# Patient Record
Sex: Female | Born: 1975 | Race: Black or African American | Hispanic: No | Marital: Single | Smoking: Never smoker
Health system: Southern US, Community
[De-identification: ages and names within clinical notes are randomized; demographics above are authoritative.]

## PROBLEM LIST (undated history)

## (undated) DIAGNOSIS — N83209 Unspecified ovarian cyst, unspecified side: Secondary | ICD-10-CM

## (undated) HISTORY — PX: NO PAST SURGERIES: SHX2092

---

## 1999-12-26 ENCOUNTER — Other Ambulatory Visit: Admission: RE | Admit: 1999-12-26 | Discharge: 1999-12-26 | Payer: Self-pay | Admitting: Obstetrics and Gynecology

## 2000-01-21 ENCOUNTER — Other Ambulatory Visit: Admission: RE | Admit: 2000-01-21 | Discharge: 2000-01-21 | Payer: Self-pay | Admitting: Obstetrics and Gynecology

## 2001-08-17 ENCOUNTER — Ambulatory Visit (HOSPITAL_COMMUNITY): Admission: RE | Admit: 2001-08-17 | Discharge: 2001-08-17 | Payer: Self-pay | Admitting: Anesthesiology

## 2001-08-17 ENCOUNTER — Encounter: Payer: Self-pay | Admitting: Anesthesiology

## 2001-08-28 ENCOUNTER — Inpatient Hospital Stay (HOSPITAL_COMMUNITY): Admission: EM | Admit: 2001-08-28 | Discharge: 2001-08-30 | Payer: Self-pay | Admitting: Family Medicine

## 2001-08-28 ENCOUNTER — Encounter: Payer: Self-pay | Admitting: General Surgery

## 2001-09-01 ENCOUNTER — Inpatient Hospital Stay (HOSPITAL_COMMUNITY): Admission: AD | Admit: 2001-09-01 | Discharge: 2001-09-01 | Payer: Self-pay | Admitting: Obstetrics and Gynecology

## 2001-09-01 ENCOUNTER — Encounter: Payer: Self-pay | Admitting: Obstetrics and Gynecology

## 2001-09-08 ENCOUNTER — Inpatient Hospital Stay (HOSPITAL_COMMUNITY): Admission: AD | Admit: 2001-09-08 | Discharge: 2001-09-11 | Payer: Self-pay | Admitting: Family Medicine

## 2001-09-08 ENCOUNTER — Encounter: Payer: Self-pay | Admitting: Surgery

## 2001-09-21 ENCOUNTER — Encounter (HOSPITAL_BASED_OUTPATIENT_CLINIC_OR_DEPARTMENT_OTHER): Payer: Self-pay | Admitting: General Surgery

## 2001-09-22 ENCOUNTER — Ambulatory Visit (HOSPITAL_COMMUNITY): Admission: RE | Admit: 2001-09-22 | Discharge: 2001-09-23 | Payer: Self-pay | Admitting: General Surgery

## 2001-09-22 ENCOUNTER — Encounter (INDEPENDENT_AMBULATORY_CARE_PROVIDER_SITE_OTHER): Payer: Self-pay | Admitting: Specialist

## 2001-09-27 ENCOUNTER — Inpatient Hospital Stay (HOSPITAL_COMMUNITY): Admission: AD | Admit: 2001-09-27 | Discharge: 2001-09-27 | Payer: Self-pay | Admitting: *Deleted

## 2002-03-18 ENCOUNTER — Inpatient Hospital Stay (HOSPITAL_COMMUNITY): Admission: AD | Admit: 2002-03-18 | Discharge: 2002-03-20 | Payer: Self-pay | Admitting: Obstetrics

## 2005-01-02 ENCOUNTER — Emergency Department (HOSPITAL_COMMUNITY): Admission: EM | Admit: 2005-01-02 | Discharge: 2005-01-03 | Payer: Self-pay | Admitting: Emergency Medicine

## 2005-01-03 ENCOUNTER — Emergency Department (HOSPITAL_COMMUNITY): Admission: EM | Admit: 2005-01-03 | Discharge: 2005-01-03 | Payer: Self-pay | Admitting: Emergency Medicine

## 2009-02-10 ENCOUNTER — Emergency Department (HOSPITAL_COMMUNITY): Admission: EM | Admit: 2009-02-10 | Discharge: 2009-02-10 | Payer: Self-pay | Admitting: Emergency Medicine

## 2009-10-28 ENCOUNTER — Emergency Department (HOSPITAL_COMMUNITY): Admission: EM | Admit: 2009-10-28 | Discharge: 2009-10-28 | Payer: Self-pay | Admitting: Family Medicine

## 2009-11-26 ENCOUNTER — Ambulatory Visit: Payer: Self-pay | Admitting: Physician Assistant

## 2009-11-26 ENCOUNTER — Inpatient Hospital Stay (HOSPITAL_COMMUNITY): Admission: AD | Admit: 2009-11-26 | Discharge: 2009-11-26 | Payer: Self-pay | Admitting: Obstetrics and Gynecology

## 2009-11-26 ENCOUNTER — Inpatient Hospital Stay (HOSPITAL_COMMUNITY): Admission: AD | Admit: 2009-11-26 | Discharge: 2009-11-26 | Payer: Self-pay | Admitting: Family Medicine

## 2009-12-12 ENCOUNTER — Inpatient Hospital Stay (HOSPITAL_COMMUNITY): Admission: AD | Admit: 2009-12-12 | Discharge: 2009-12-12 | Payer: Self-pay | Admitting: Obstetrics & Gynecology

## 2009-12-20 ENCOUNTER — Inpatient Hospital Stay (HOSPITAL_COMMUNITY): Admission: AD | Admit: 2009-12-20 | Discharge: 2009-12-20 | Payer: Self-pay | Admitting: Obstetrics

## 2009-12-20 ENCOUNTER — Ambulatory Visit: Payer: Self-pay | Admitting: Gynecology

## 2009-12-24 ENCOUNTER — Ambulatory Visit (HOSPITAL_COMMUNITY): Admission: RE | Admit: 2009-12-24 | Discharge: 2009-12-24 | Payer: Self-pay | Admitting: Obstetrics

## 2010-05-17 ENCOUNTER — Inpatient Hospital Stay (HOSPITAL_COMMUNITY)
Admission: AD | Admit: 2010-05-17 | Discharge: 2010-05-21 | Payer: Self-pay | Source: Home / Self Care | Attending: Obstetrics | Admitting: Obstetrics

## 2010-05-19 ENCOUNTER — Encounter (INDEPENDENT_AMBULATORY_CARE_PROVIDER_SITE_OTHER): Payer: Self-pay | Admitting: Obstetrics

## 2010-07-29 LAB — URINALYSIS, ROUTINE W REFLEX MICROSCOPIC
Bilirubin Urine: NEGATIVE
Nitrite: NEGATIVE
Specific Gravity, Urine: 1.02 (ref 1.005–1.030)
Urobilinogen, UA: 1 mg/dL (ref 0.0–1.0)

## 2010-07-29 LAB — CBC
HCT: 38.2 % (ref 36.0–46.0)
Hemoglobin: 13 g/dL (ref 12.0–15.0)
Hemoglobin: 8.8 g/dL — ABNORMAL LOW (ref 12.0–15.0)
MCH: 28.1 pg (ref 26.0–34.0)
MCHC: 32.4 g/dL (ref 30.0–36.0)
MCHC: 34 g/dL (ref 30.0–36.0)
MCV: 82.5 fL (ref 78.0–100.0)
MCV: 84.7 fL (ref 78.0–100.0)
Platelets: 212 10*3/uL (ref 150–400)
RDW: 15.6 % — ABNORMAL HIGH (ref 11.5–15.5)
WBC: 27.7 10*3/uL — ABNORMAL HIGH (ref 4.0–10.5)

## 2010-07-29 LAB — RPR: RPR Ser Ql: NONREACTIVE

## 2010-07-29 LAB — RAPID URINE DRUG SCREEN, HOSP PERFORMED
Cocaine: NOT DETECTED
Opiates: NOT DETECTED
Tetrahydrocannabinol: NOT DETECTED

## 2010-07-29 LAB — MAGNESIUM: Magnesium: 5.7 mg/dL — ABNORMAL HIGH (ref 1.5–2.5)

## 2010-07-29 LAB — URINE MICROSCOPIC-ADD ON

## 2010-08-02 LAB — URINALYSIS, ROUTINE W REFLEX MICROSCOPIC
Bilirubin Urine: NEGATIVE
Glucose, UA: NEGATIVE mg/dL
Specific Gravity, Urine: >1.03 — ABNORMAL HIGH (ref 1.005–1.030)
Urobilinogen, UA: 1 mg/dL (ref 0.0–1.0)

## 2010-08-02 LAB — URINE MICROSCOPIC-ADD ON

## 2010-08-02 LAB — URINE CULTURE

## 2010-08-03 LAB — URINALYSIS, ROUTINE W REFLEX MICROSCOPIC
Glucose, UA: NEGATIVE mg/dL
Hgb urine dipstick: NEGATIVE
Ketones, ur: NEGATIVE mg/dL
Protein, ur: NEGATIVE mg/dL
Specific Gravity, Urine: >1.03 — ABNORMAL HIGH (ref 1.005–1.030)
Urobilinogen, UA: 0.2 mg/dL (ref 0.0–1.0)
pH: 6 (ref 5.0–8.0)

## 2010-08-04 LAB — URINALYSIS, ROUTINE W REFLEX MICROSCOPIC
Bilirubin Urine: NEGATIVE
Glucose, UA: NEGATIVE mg/dL
Hgb urine dipstick: NEGATIVE
Ketones, ur: 15 mg/dL — AB
Protein, ur: NEGATIVE mg/dL
Specific Gravity, Urine: 1.02 (ref 1.005–1.030)
Urobilinogen, UA: 1 mg/dL (ref 0.0–1.0)
pH: 7 (ref 5.0–8.0)

## 2010-08-04 LAB — GC/CHLAMYDIA PROBE AMP, GENITAL
Chlamydia, DNA Probe: NEGATIVE
GC Probe Amp, Genital: NEGATIVE

## 2010-08-04 LAB — CBC
Hemoglobin: 14.4 g/dL (ref 12.0–15.0)
MCH: 29.3 pg (ref 26.0–34.0)

## 2010-08-04 LAB — WET PREP, GENITAL

## 2010-08-04 LAB — URINE MICROSCOPIC-ADD ON

## 2010-10-04 NOTE — Discharge Summary (Signed)
[place]. [facility]  Patient:    Janice Bautista, Janice Bautista Visit Number: [id number] MRN: [id number]          Service Type: OBS Location: [facility] Attending Physician:  Michaelle Copas Dictated by:   Lorelle Formosa, M.D. Admit Date:  09/27/2001 Discharge Date: 09/27/2001   CC:         Luisa Hart L. Lurene Shadow, M.D.   Discharge Summary  ADMISSION DIAGNOSES: 1. Flare-up of cholecystitis with calculous gallbladder disease. 2. Second term missed pregnancy.  DISCHARGE DIAGNOSES: 1. Flare-up of cholecystitis with calculous gallbladder disease. 2. Second term missed pregnancy.  DISCHARGE MEDICATIONS: 1. Vicodin. 2. Phenergan.  CONSULTATIONS: Dr. Leonie Man.  DIET: Low fat.  HISTORY OF PRESENT ILLNESS: The patient is a 35 year old single black woman who presented with repeated nausea and vomiting and intensive epigastric pain radiating to her back. She has known diagnosis of calculous gallbladder disease and is now pregnant. She has not been able to sleep for the past 24+ h ours. She is not able to keep down Vicodin or any other symptomatic medications. The patient has no history of birth control pill use. Her last admission was August 28, 2001 where her gallbladder tests revealed calculous gallbladder disease. She is approximately six weeks pregnant.  PAST MEDICAL HISTORY: As outlined in the admission H&P.  FAMILY HISTORY: As outlined in the admission H&P.  SOCIAL HISTORY: As outlined in the admission H&P.  REVIEW OF SYSTEMS: As outlined in the admission H&P.  PHYSICAL EXAM:  VITAL SIGNS: BP 103/71. Pulse 98, respirations 22, temp 98.2.  GENERAL: The patient appeared uncomfortable, crying, and rolling on the bed.  HEENT: PERRLA. EOMI.  NECK: Supple.  CHEST: Clear to auscultation.  BREASTS: Normal.  ABDOMEN: Tenderness in epigastrium to palpation.  EXTREMITIES: Without clubbing, cyanosis, or edema.  NEURO: Grossly normal.  SKIN: No  lesions.  LABORATORY DATA: CBC revealed white count of 11.6, hemoglobin 13.1. Repeat revealed white count of 7 with hemoglobin of 11.1. Platelets were normal at 215,000. Chemistry revealed sodium of 132, potassium 3.5, chloride 102, BUN 5.  DIAGNOSTIC STUDIES: Abdominal ultrasound reveals an interval development of tumefactor sludge within the proximal portion of the gallbladder with cholesence demonstrated with small cholesterol stones at the anterior margin of the tumefactor sludge. Otherwise, within normal limits.  HOSPITAL COURSE: The patient was treated with symptomatic medications and Cefotan 1 gram IV q.12h. She was managed with a bland diet and given IV fluids. She gradually improved and will be discharged for outpatient management. The effort is to try to get her into the second trimester before considering laparoscopic cholecystectomy. Dictated by:   Lorelle Formosa, M.D. Attending Physician:  Michaelle Copas DD:  10/13/01 TD:  10/15/01 Job: [id number]

## 2010-10-04 NOTE — Op Note (Signed)
[place]. [facility]  Patient:    Janice Bautista, Janice Bautista Visit Number: [id number] MRN: [id number]          Service Type: DSU Location: [phone] Attending Physician:  Sonda Primes Dictated by:   Mardene Celeste. Lurene Shadow, M.D. Proc. Date: 09/22/01 Admit Date:  09/22/2001 Discharge Date: 09/23/2001                             Operative Report  PREOPERATIVE DIAGNOSIS:  Cholecystitis with cholesterolosis.  POSTOPERATIVE DIAGNOSIS:  Cholecystitis with cholesterolosis, pathology pending.  OPERATION PERFORMED:  Laparoscopic cholecystectomy.  SURGEON:  Mardene Celeste. Lurene Shadow, M.D.  ANESTHESIA:  INDICATIONS FOR PROCEDURE:  The patient is a 35 year old woman who is at this point [redacted] weeks pregnant who was admitted to the hospital complaining of abdominal pain.  On work-up she was noted to be afebrile with normal liver function studies.  She did have multiple cholesterol type stones in her gallstones.  She has since been having recurrent and persistent abdominal pain associated with nausea and with vomiting.  The patient is brought to the operating room now for laparoscopic cholecystectomy.  The patient is aware of the risks and potential benefits of laparoscopic cholecystectomy including the risk of fetal loss due to this operative procedure.  She understands these risks and wishes to proceed with the surgery nonetheless.  She gives consent.  DESCRIPTION OF PROCEDURE:  Following the induction of satisfactory general anesthesia, the patient was positioned supinely.  The abdomen was prepped and draped to be included in a sterile operative field.  Open laparoscopy created at the umbilicus with insufflation of the peritoneal cavity to 14 mHg pressure using carbon dioxide.  The abdomen was explored.  Multiple adhesions to the wall of the gallbladder, but no acute inflammation was noted.  The liver edges were sharp, liver surface was smooth, the anterior gastric wall,  duodenal sweep were normal.  There was no small or large intestine viewed that appeared to be abnormal.  On viewing the pelvis, she had normal-appearing three-month size uterus.  Under direct vision, epigastric and lateral ports were placed.  The gallbladder was then grasped and retracted cephalad.  Dissection in the region of the hepatoduodenal ligament allowed isolation of the cystic artery and cystic duct.  The cystic artery was doubly clipped and transected.  The cystic duct was triply clipped and transected.  The gallbladder was then dissected free from the liver bed using electrocautery and maintaining hemostasis throughout the entire course of dissection.  At the end of this period, the pneumoperitoneum was released.  Sponge, instrument and sharp counts were verified. The wound was closed in layers as follows.  The umbilical wound in two layers with 0 Dexon and 4-0 Dexon.  Epigastric wound and lateral flank wounds closed with 4-0 Dexon and reinforced with Steri-Strips.  Sterile dressings applied.  Anesthetic reversed.  Patient removed from the operating room to the recovery room in stable condition having tolerated the procedure well. Dictated by:   Mardene Celeste. Lurene Shadow, M.D. Attending Physician:  Sonda Primes DD:  09/22/01 TD:  09/23/01 Job: [id number]

## 2010-10-04 NOTE — Consult Note (Signed)
[place]. [facility]  Patient:    Janice Bautista, Janice Bautista Visit Number: [id number] MRN: [id number]          Service Type: MED Location: [phone] Attending Physician:  Judie Petit Dictated by:   Angelia Mould. Derrell Lolling, M.D. Proc. Date: 08/28/01 Admit Date:  08/28/2001   CC:         Lorelle Formosa, M.D.   Consultation Report  CHIEF COMPLAINT:  Epigastric pain and vomiting.  HISTORY OF PRESENT ILLNESS:  This is a 35 year old black female who is 2 months pregnant. Last menstrual period June 30, 2001. She has a 4-week history of daily epigastric pain, nausea and vomiting. This will come and go but has been almost daily. She denies that it is precipitated by meals but says that she does feel worse after eating. She denies fever, chills. Denies diarrhea. Has never had any liver disease, jaundice, or ulcer disease in the past and has been healthy.  She was evaluated by Dr. Ronne Binning as an outpatient. An ultrasound was performed on April 1st of this year at [facility]. That ultrasound shows sludge in the gallbladder but no gallstones were seen. There was no inflammatory change and the bile ducts were normal. Basically a normal scan except for sludge. Elective cholecystectomy has been contemplated and she was going to see Dr. Lurene Shadow as an outpatient for evaluation to see if this was appropriate. The pain became unbearable today. She called Dr. Ronne Binning and he admitted her for further evaluation. She has had no lab work.  PAST MEDICAL HISTORY:  She has had three pregnancies, one therapeutic abortion, one miscarriage, and intends to carry this pregnancy through. She states she had a positive pregnancy test [facility] the [facility] on [facility]th. No history of diabetes, hypertension, Sickle cell disease, or hepatitis.  PAST SURGICAL HISTORY:  Negative.  CURRENT MEDICATIONS:  Nexium.  ALLERGIES:  None known.  SOCIAL HISTORY:  The patient lives  in [place]. She is single; she is unemployed; she lives with her friend; denies the use of alcohol or tobacco.  FAMILY HISTORY:  Mother living and well, father living and well.  REVIEW OF SYSTEMS:  All systems are reviewed and are noncontributory except as described above.  PHYSICAL EXAMINATION:  GENERAL:  Alert young black female in minimal distress.  VITAL SIGNS:  Temperature 98.5, heart rate 74, respiratory rate 20, blood pressure 107/50.  HEENT:  Sclerae clear. Extraocular movements intact. Oropharynx clear.  NECK:  Supple, nontender, no mass, no adenopathy, no thyromegaly, no bruit.  LUNGS:  Clear to auscultation, no CVA tenderness.  HEART:  Regular rate and rhythm, no murmur.  ABDOMEN:  Soft with hyperactive bowel sounds. She is not distended. There is no palpable mass. She is tender in the epigastrium. This is not lateralizing. She has a minimal guarding and no rebound. There is no hernia.  EXTREMITIES:  No edema, good pulses.  NEUROLOGICAL:  This is a normal image.  IMPRESSION: 1. Epigastric pain and vomiting, etiology unclear. Considerations are    acalculous cholecystitis, calculus cholecystitis, acute pancreatitis,    peptic ulcer disease, or other forgot pathology. 2. Her ultrasound of her gallbladder is nondiagnostic. 3. First trimester pregnancy.  PLAN: 1. The patient will be treated presumptively for cholecystitis until we can    collaborate her diagnosis. 2. She will be kept NPO and started on IV Ancef. 3. Lab work is drawn and is pending. 4. We will take her downstairs later today for further imaging  of the    gallbladder. We will do a hepatobiliary scan if radiology feels this is    safe. Otherwise we will repeat her ultrasound. Dictated by:   Angelia Mould. Derrell Lolling, M.D. Attending Physician:  Judie Petit DD:  08/28/01 TD:  08/29/01 Job: [phone]

## 2010-10-04 NOTE — Consult Note (Signed)
[place]. [facility]  Patient:    Janice Bautista, Janice Bautista Visit Number: [id number] MRN: [id number]          Service Type: MED Location: [phone] Attending Physician:  Judie Petit Proc. Date: 09/09/01 Admit Date:  09/08/2001                            Consultation Report  HISTORY OF PRESENT ILLNESS:  Patient is a 35 year old African-American female, G2, P0-0-1-0, with a last menstrual period of June 30, 2001, and has an estimated gestational age of 10-2/7ths weeks by LMP who presented for a second hospital stay secondary to symptomatic gallstones.  Patient stated that she had some fried chicken and lasagna and then started to have severe nausea and vomiting after eating that a couple days ago and that is what brought her in for a hospital stay.  Patient stated that she was hospitalized about two weeks ago for the same reason and the symptoms were exactly the same.  Patient has had episodic nausea and vomiting in the last two weeks.  However, when she is not eating, she has no nausea and vomiting at all.  Patient currently today is without complaints.  She has no nausea, vomiting, no abdominal pain, no cramping, and no vaginal bleeding.  PAST MEDICAL HISTORY:  Unremarkable.  PAST SURGICAL HISTORY:  Unremarkable.  MEDICATIONS:  Dilaudid and Phenergan as needed.  Before she came in, she was taking some Vicodin for pain.  She has no known drug allergies, no history of abnormal Pap smear, history of sexually transmitted diseases, and obesity significant for a spontaneous abortion, first trimester, in September 2002 in which no D&E was done.  An acute abdominal ultrasound which showed some gallbladder sludge.  Stone that was seen on previous ultrasound two weeks ago were not seen and patient has no common bile duct dilation.  PHYSICAL EXAMINATION:  VITAL SIGNS:  Temperature max was 100.0, temperature current is 99.3.  Blood pressure is  111/61, pulses 80, respiratory rate is 20.  GENERAL:  Patient is in no apparent distress.  HEART:  Regular rate and rhythm.  LUNGS:  Clear to auscultation bilaterally.  ABDOMEN:  Nondistended, soft, and nontender.  EXTREMITIES:  No clubbing, cyanosis, or edema.  LABORATORY DATA:  Amylase and lipase are normal at 91 and 22 respectively. AST is 19, ALT is 13.  Total bilirubin 0.4 and alkaline phosphatase was 58. Patient is pregnant at 10-2/7ths weeks with resolving calculous cholecystitis.  RECOMMENDATIONS:  My recommendation is because the patient is doing so well and because her labs are normal and unchanged from her previous visit, would try to advance her diet as tolerated to a bland diet and recommend a nutritionist to help her with meal options since she is pregnant.  I would also recommend waiting until the second trimester, this after 12 to 13 weeks, to attempt cholecystectomy which would decrease her chances of a miscarriage. However, if the patients status does decline, any increase in her liver function tests or unable to tolerate any food or temperature greater than 100.0, would just proceed with the cholecystectomy as soon as possible.  From the literature, it is stated that you can do laparoscopy or open laparotomy and the pregnant patients usually all do well with cholecystectomy.  However, of course, if the patient has the most speedy recovery with either open or closed laparoscopy.  Patients white count was slightly elevated but very  much unchanged from her last hospital stay.  It is probably secondary to margination of pregnancy.  I would just recommend to recheck a CBC with differential in the morning to get a better idea about her white count.  Thank you again for the consultation.  I will be available on my pager if you have any questions.  We will see the patient in our office to follow up with [place].  Also, one of the differential diagnoses that came to mind is  could this also be nausea and vomiting in pregnancy.  It is very unlikely because the patient gets worsened episodes with fatty foods.  She is also not nauseous without eating.  When she eats bland foods, she is actually able to tolerate them and the patient states that this is a longstanding issue, actually had been having some kind of pain with fatty foods for about the last five to six years. Attending Physician:  Judie Petit DD:  09/09/01 TD:  09/09/01 Job: [phone]

## 2010-11-02 IMAGING — US US OB COMP LESS 14 WK
1 series · 13 of 23 positions shown · non-contrast
Comparison: None.

CLINICAL DATA: Abdominal pain.

OBSTETRIC <14 WK ULTRASOUND
TECHNIQUE: Transabdominal ultrasound was performed for evaluation
of the gestation as well as the maternal uterus and adnexal
regions.

[Series 1: us ob comp less 14 wks · 0.20mm/px · 13 of 23 slices shown]
[im 1/23]
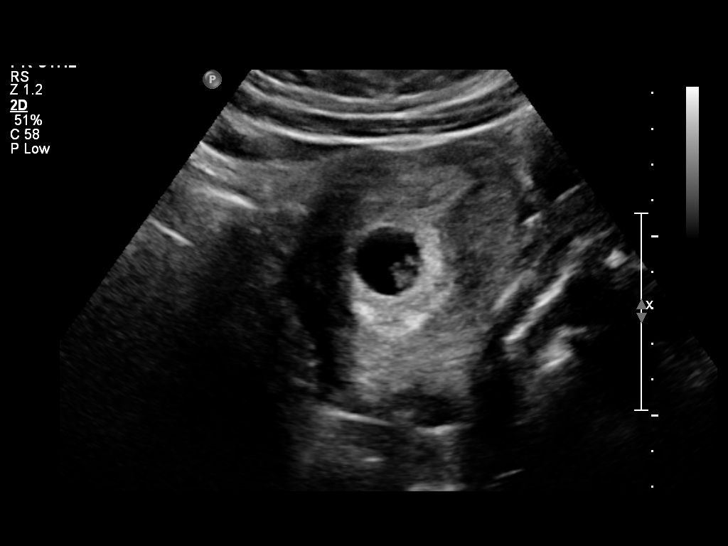
[im 3/23]
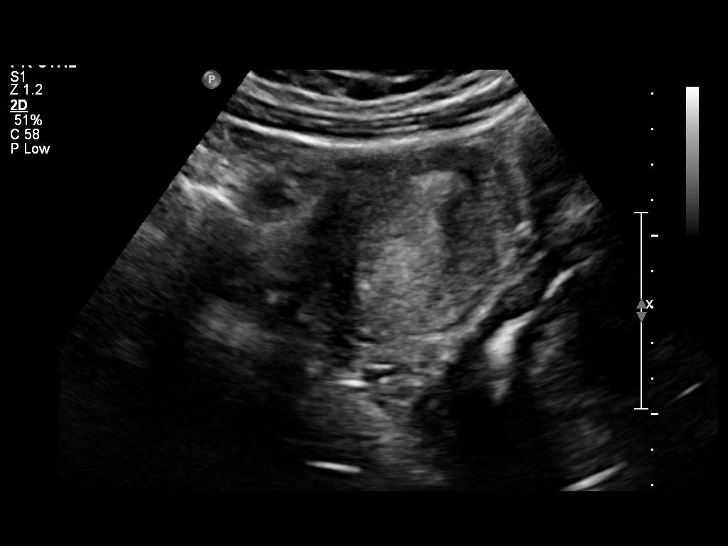
[im 5/23]
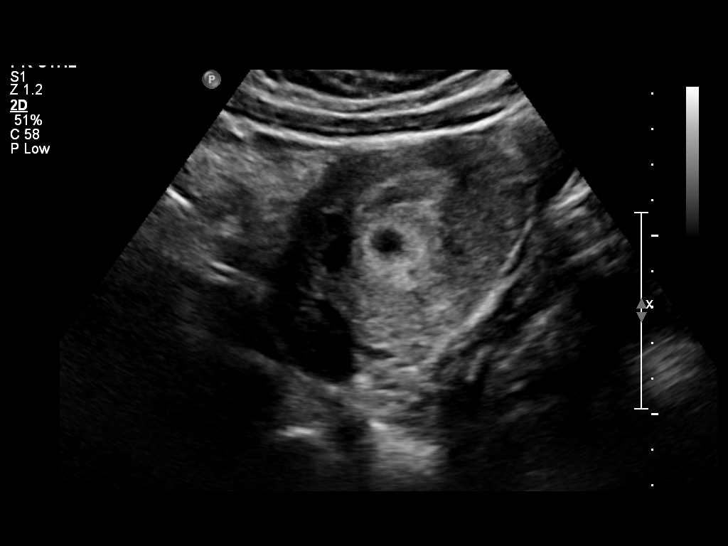
[im 7/23]
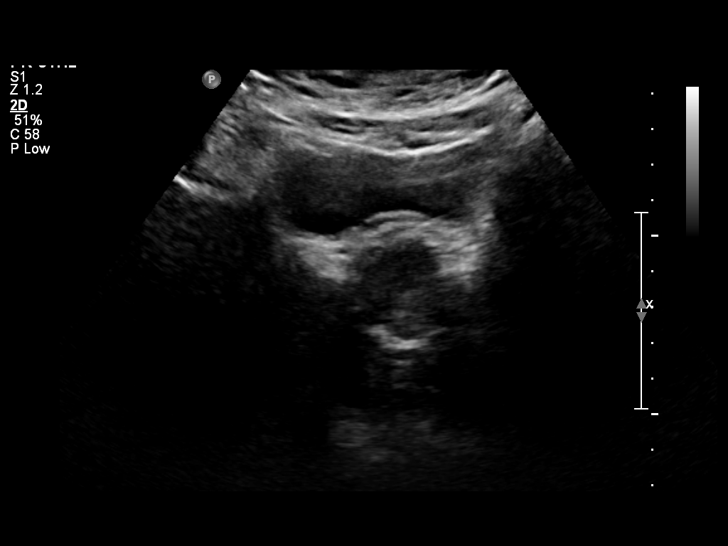
[im 8/23]
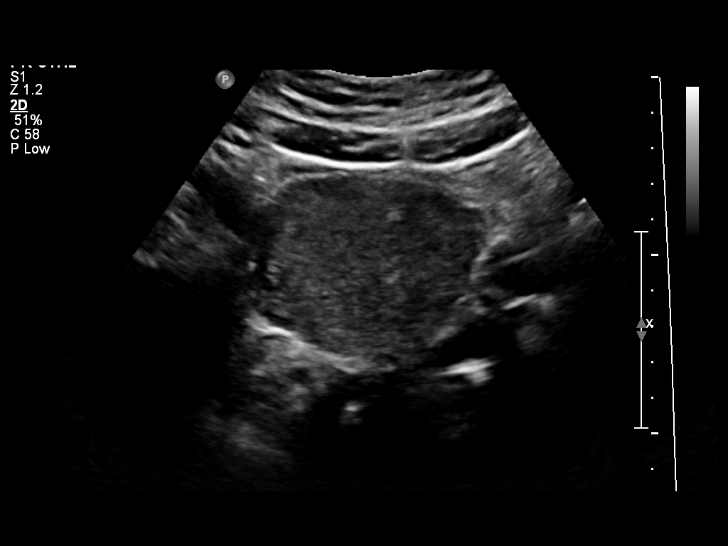
[im 10/23]
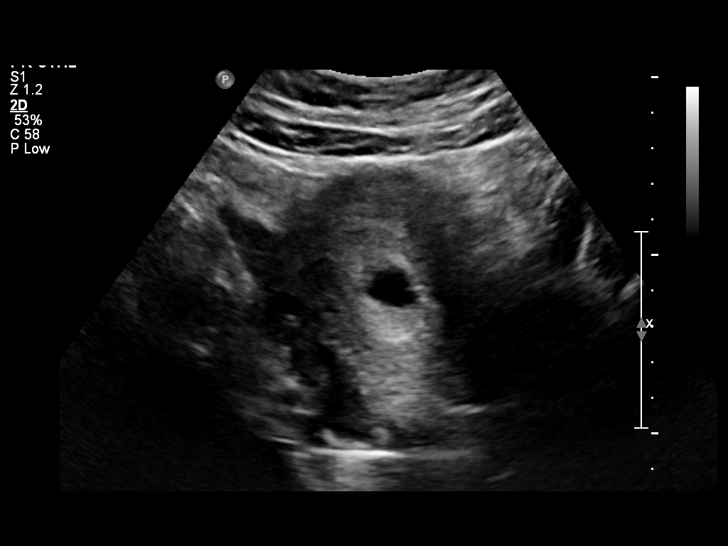
[im 12/23]
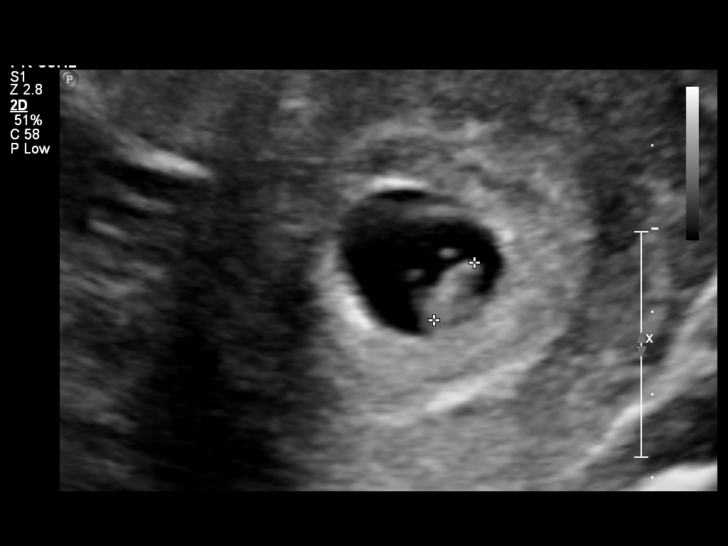
[im 14/23]
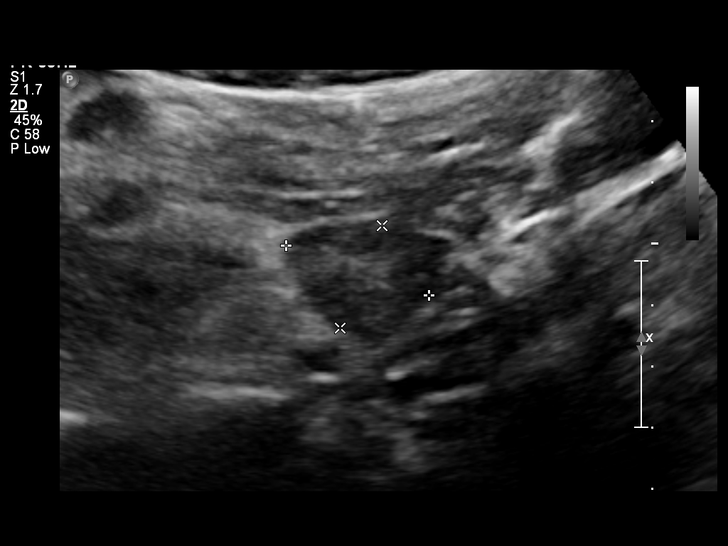
[im 16/23]
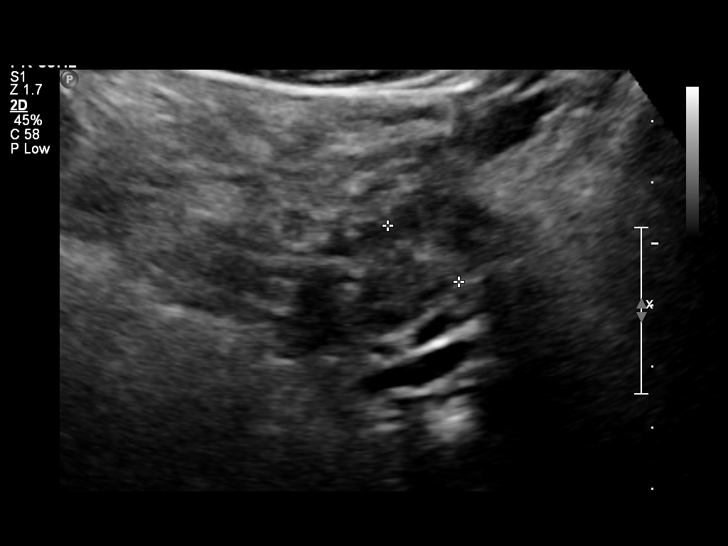
[im 17/23]
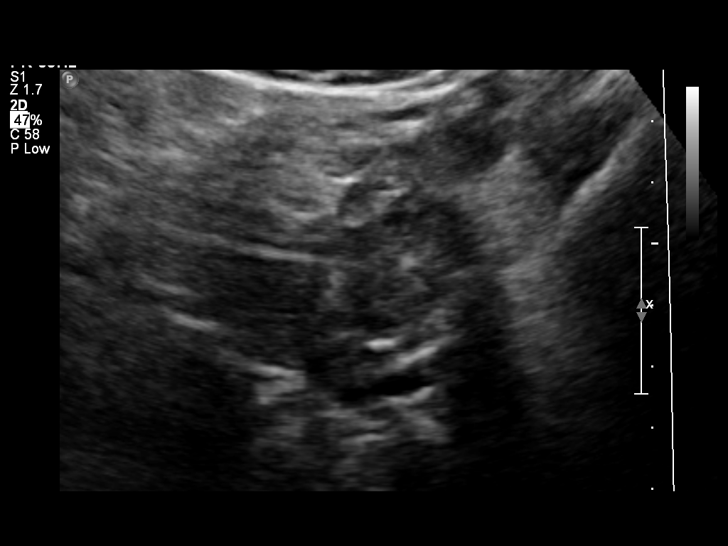
[im 19/23]
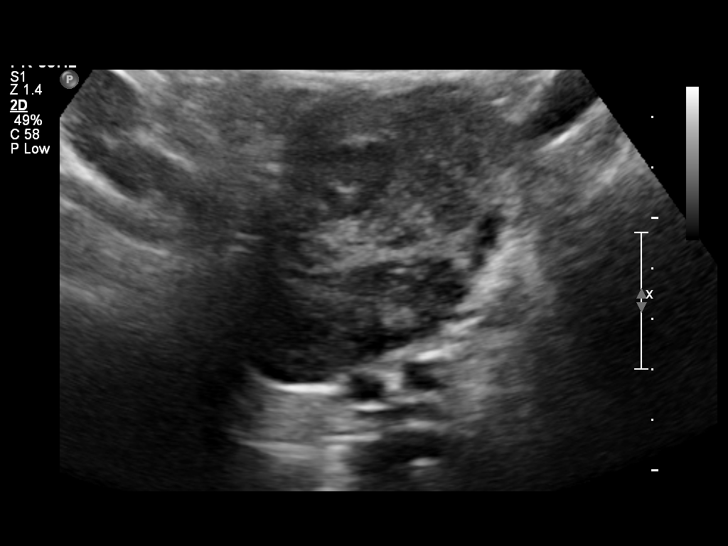
[im 21/23]
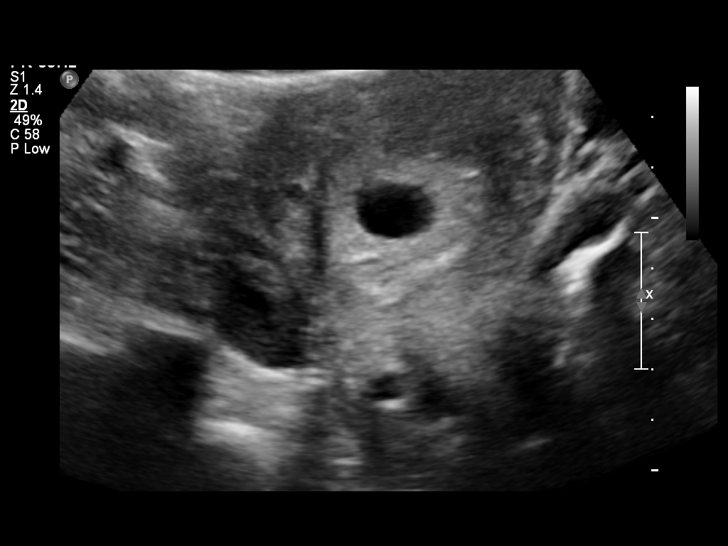
[im 23/23]
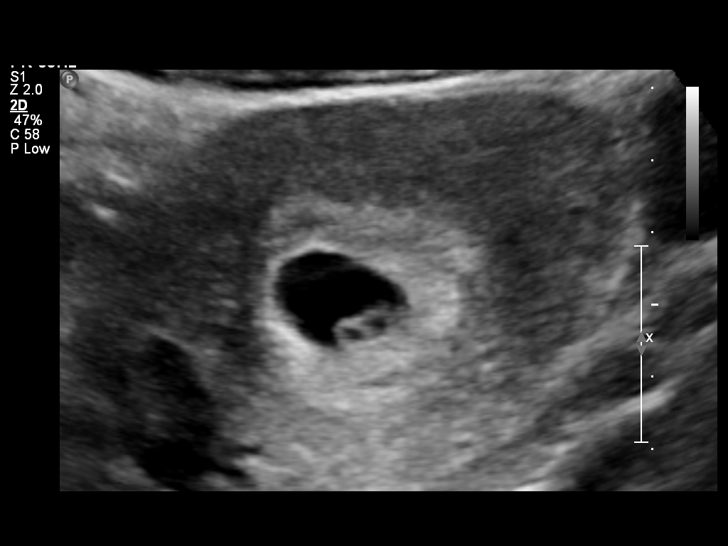

[13 of 23 positions shown; findings below may reference images not displayed]

FINDINGS: A single intrauterine gestational sac is identified.  A yolk sac is
seen.  An embryo is appreciated, with a crown-rump length of
mm, corresponding to a gestational age of 7 weeks 1 day.  Cardiac
activity is visualized; the measured heart rate is 155 beats per
minute.

No significant subchorionic hemorrhage is identified.  The uterus
is otherwise unremarkable in appearance.  Myometrial echogenicity
is within normal limits.

The ovaries are unremarkable in appearance.  The right ovary
measures 4.2 x 1.7 x 1.4 cm, while the left ovary measures 2.4 x
1.8 x 1.5 cm.  No suspicious adnexal masses are seen.  There is no
evidence of ovarian torsion.

No free fluid is seen within the pelvic cul-de-sac.
IMPRESSION: Single live intrauterine pregnancy, with a crown-rump length of
mm, corresponding to a gestational age of 7 weeks 1 day.  This does
not match the patient's gestational age by LMP, and reflects an
estimated date of delivery July 14, 2010.

## 2010-12-10 ENCOUNTER — Inpatient Hospital Stay (INDEPENDENT_AMBULATORY_CARE_PROVIDER_SITE_OTHER)
Admission: RE | Admit: 2010-12-10 | Discharge: 2010-12-10 | Disposition: A | Discharge status: Home / Self Care | Payer: Self-pay | Source: Ambulatory Visit | Attending: Emergency Medicine | Admitting: Emergency Medicine

## 2010-12-10 DIAGNOSIS — R079 Chest pain, unspecified: Secondary | ICD-10-CM

## 2011-04-24 IMAGING — US US OB COMP +14 WK
1 series · 12 of 28 positions shown · non-contrast
Comparison: none

[Series 1: us ob comp +14 wk · 12 of 48 slices shown]
[im 2/48]
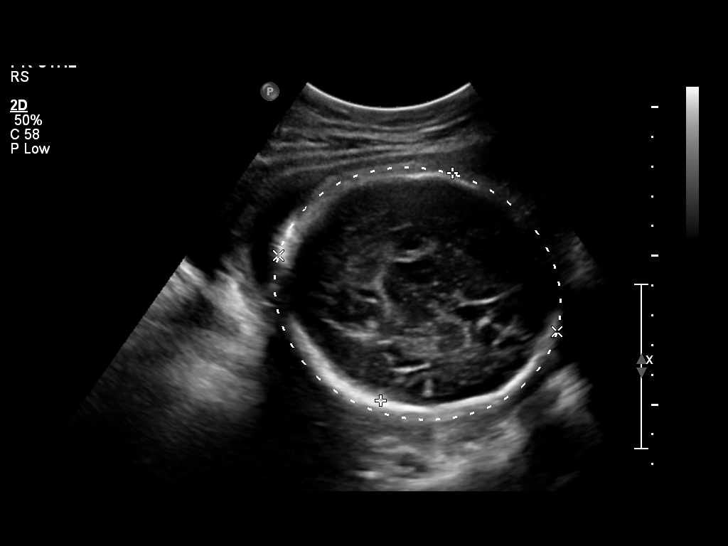
[im 6/48]
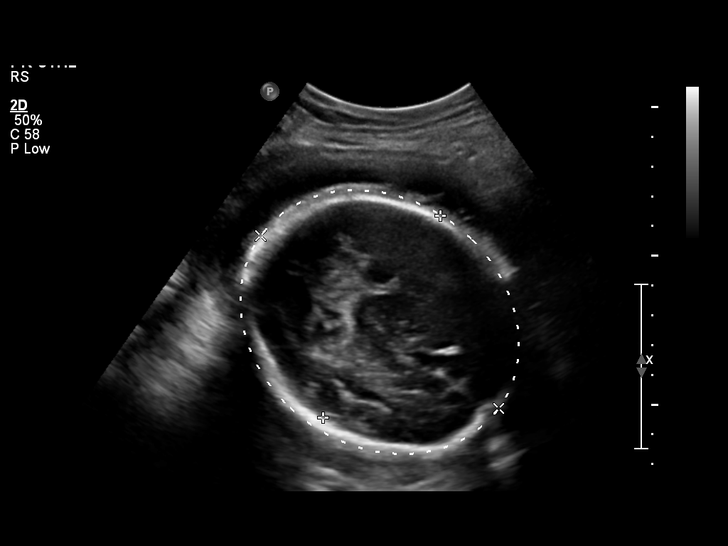
[im 9/48]
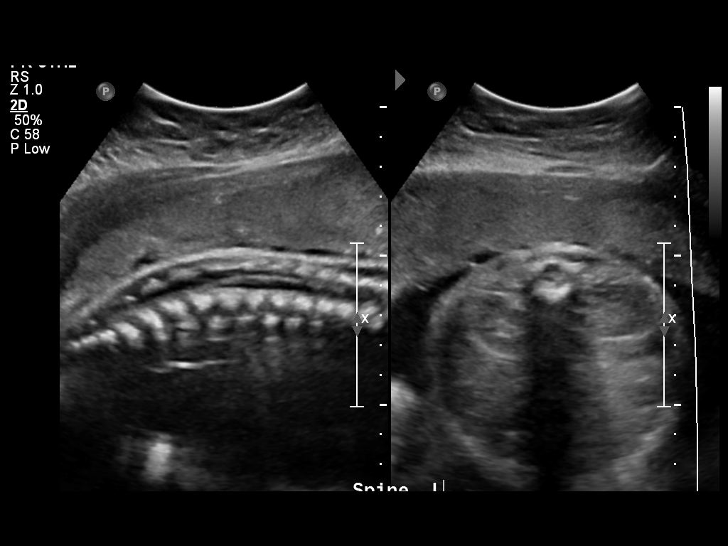
[im 14/48]
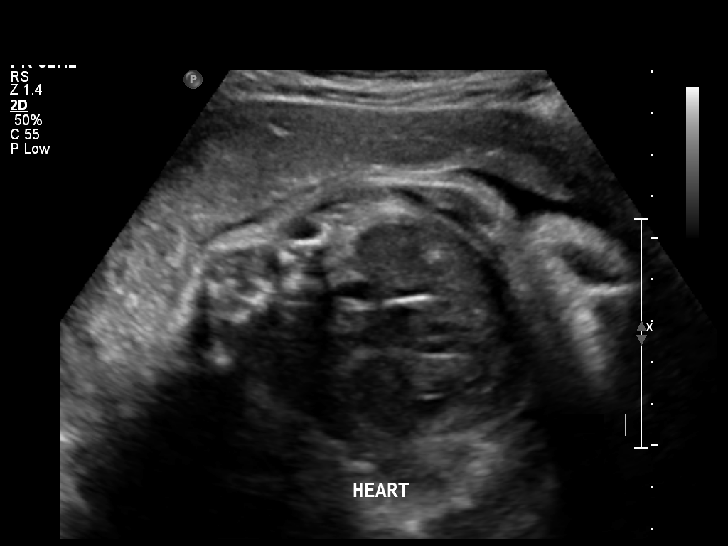
[im 18/48]
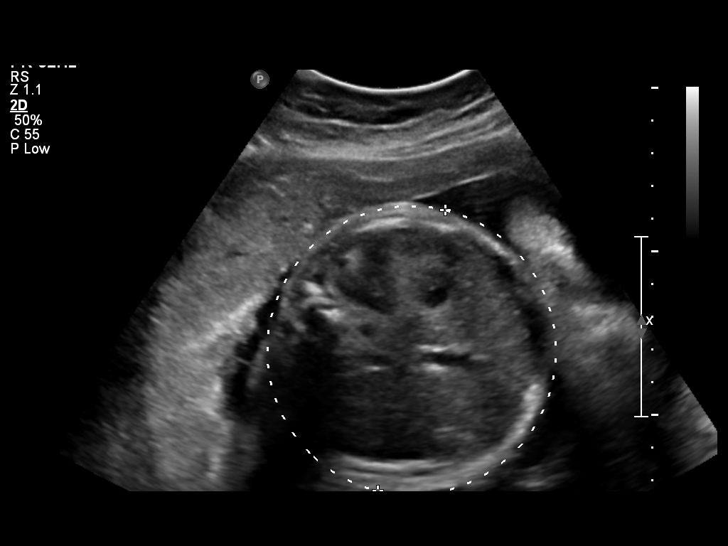
[im 21/48]
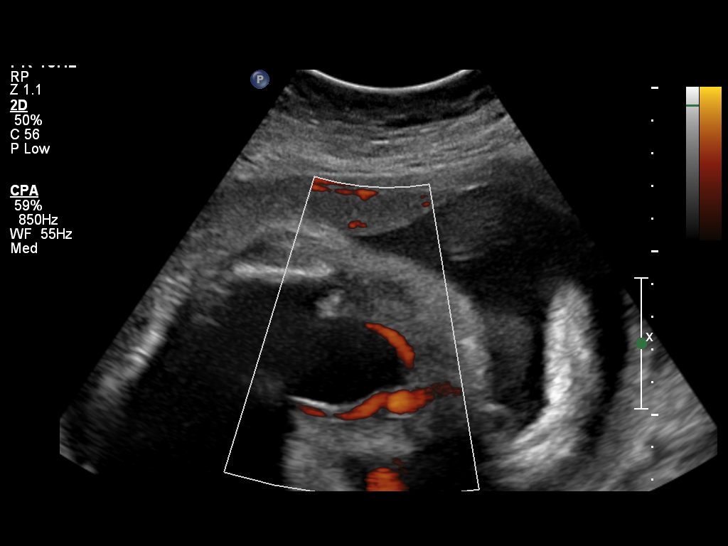
[im 27/48]
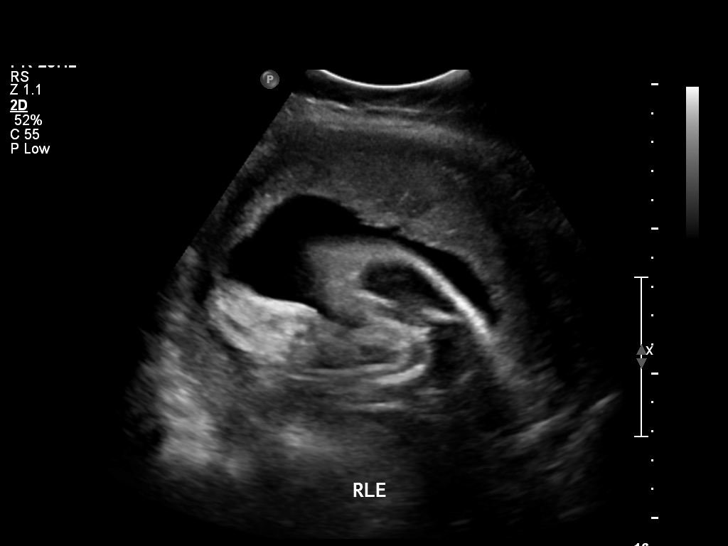
[im 30/48]
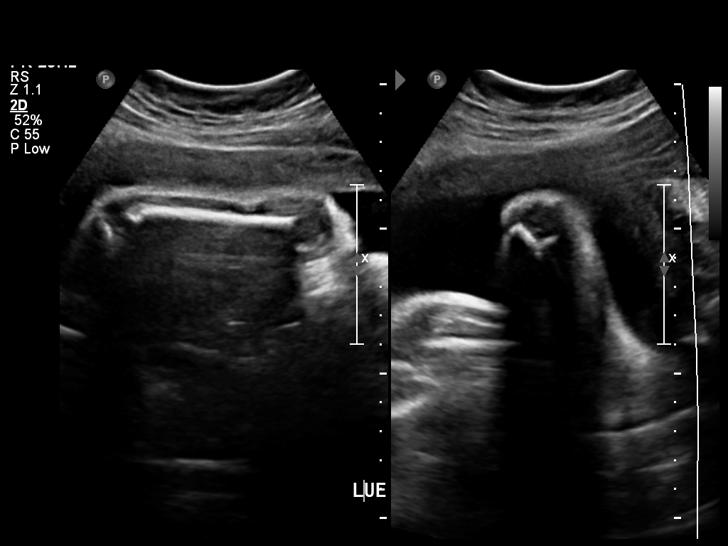
[im 34/48]
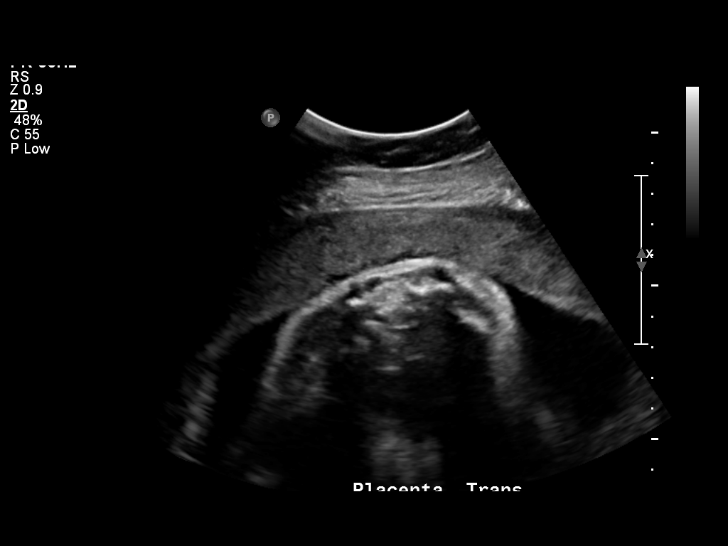
[im 39/48]
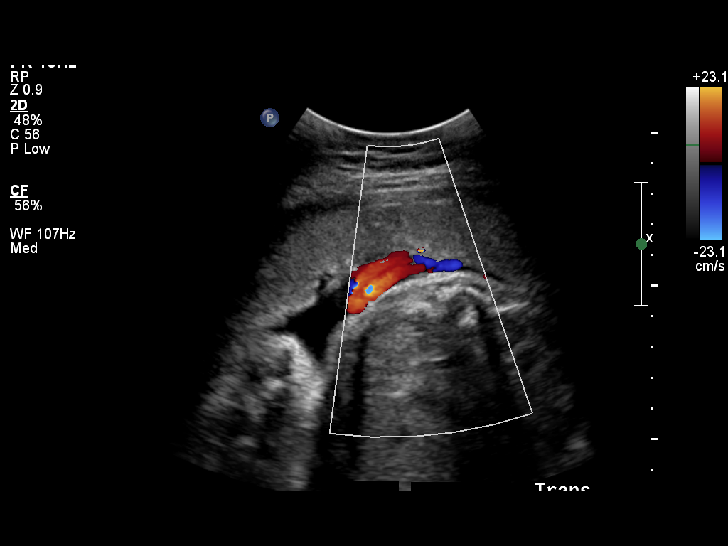
[im 42/48]
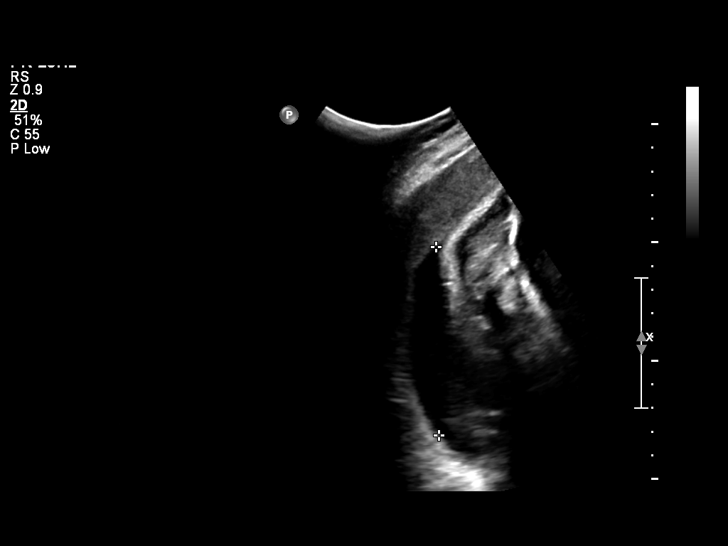
[im 46/48]
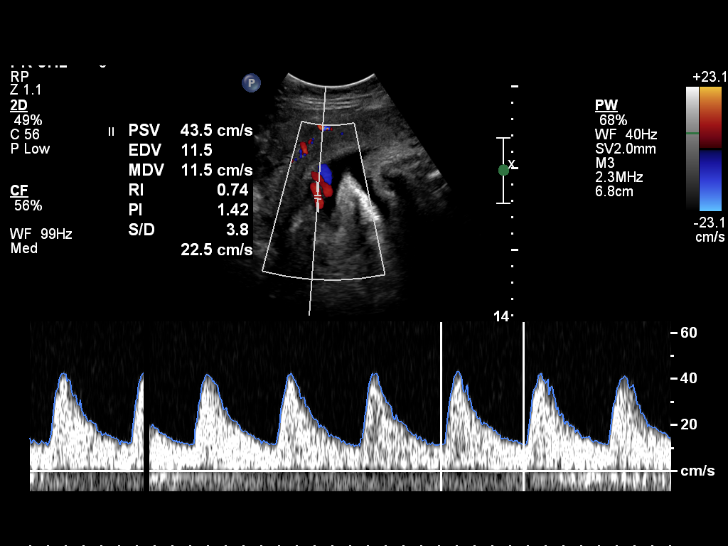

[12 of 28 positions shown; findings below may reference images not displayed]

OBSTETRICS REPORT
                      (Signed Final 05/18/2010 [DATE])

 Order#:         _I
Procedures

 US OB COMP +14 WK                                     76805.1
 US UA Cord Doppler                                    47317
Indications

 Preterm labor (> 22 weeks)
 Assess Fetal Growth / Estimated Fetal Weight
 Uncertain fetal presentation
Fetal Evaluation

 Fetal Heart Rate:  149                          bpm
 Cardiac Activity:  Observed
 Presentation:      Cephalic
 Placenta:          Anterior, above cervical os
 P. Cord            Visualized
 Insertion:

 Amniotic Fluid
 AFI FV:      Subjectively upper-normal
 AFI Sum:     23.52   cm       92  %Tile     Larg Pckt:    7.94  cm
 RUQ:   7.94    cm   RLQ:    6.79   cm    LUQ:   2.19    cm   LLQ:    6.6    cm
Biometry

 BPD:       79  mm     G. Age:  31w 5d                CI:        79.83   70 - 86
                                                      FL/HC:      22.7   19.1 -

 HC:     279.4  mm     G. Age:  30w 4d      < 3  %    HC/AC:      1.00   0.96 -

 AC:       279  mm     G. Age:  32w 0d       51  %    FL/BPD:     80.4   71 - 87
 FL:      63.5  mm     G. Age:  32w 6d       64  %    FL/AC:      22.8   20 - 24

 Est. FW:    2092  gm      4 lb 3 oz     60  %
Gestational Age

 Clinical EDD:  33w 4d                                        EDD:   07/02/10
 U/S Today:     31w 5d                                        EDD:   07/15/10
 Best:          31w 6d     Det. By:  Early Ultrasound         EDD:   07/14/10
Anatomy
 Cranium:           Appears normal      Aortic Arch:       Basic anatomy
                                                           exam per order
 Fetal Cavum:       Appears normal      Ductal Arch:       Basic anatomy
                                                           exam per order
 Ventricles:        Not well            Diaphragm:         Basic anatomy
                    visualized                             exam per order
 Choroid Plexus:    Not well            Stomach:           Appears
                    visualized                             normal, left
                                                           sided
 Cerebellum:        Not well            Abdomen:           Appears normal
                    visualized
 Posterior Fossa:   Not well            Abdominal Wall:    Not well
                    visualized                             visualized
 Nuchal Fold:       Not applicable      Cord Vessels:      Appears normal
                    (>20 wks GA)                           (3 vessel cord)
 Face:              Not well            Kidneys:           Appear normal
                    visualized
 Heart:             Not well            Bladder:           Appears normal
                    visualized
 RVOT:              Basic anatomy       Spine:             Appears normal
                    exam per order
 LVOT:              Basic anatomy       Limbs:             Four extremities
                    exam per order                         seen

 Other:     Technically difficult due to advanced GA and fetal
            position.
Doppler - Fetal Vessels

 Umbilical Artery
 S/D:   4.2        > 97.5  %tile

Cervix Uterus Adnexa

 Cervix:       Appears dilated, 5.9 cm.
 Uterus:       No abnormality visualized.
 Cul De Sac:   No free fluid seen.
 Left Ovary:    Not visualized.
 Right Ovary:   Not visualized.
Impression

 Single live IUP in cephalic presentation.
 Cervix open, no measurable residual length.
 HC <3%ile while EFW 60%ile, may indicate IUGR although
 could reflect dolichocephaly related to presentation.
 UA Doppler demonstrates maintained diastolic flow but S/D
 ratio quantitatively measures >97%ile.

 questions or concerns.

## 2014-08-25 ENCOUNTER — Encounter (HOSPITAL_COMMUNITY): Payer: Self-pay | Admitting: *Deleted

## 2014-08-25 ENCOUNTER — Inpatient Hospital Stay (HOSPITAL_COMMUNITY)
Admission: AD | Admit: 2014-08-25 | Discharge: 2014-08-25 | Disposition: A | Discharge status: Home / Self Care | Payer: 59 | Source: Ambulatory Visit | Attending: Family Medicine | Admitting: Family Medicine

## 2014-08-25 DIAGNOSIS — N751 Abscess of Bartholin's gland: Secondary | ICD-10-CM

## 2014-08-25 DIAGNOSIS — N750 Cyst of Bartholin's gland: Secondary | ICD-10-CM | POA: Diagnosis not present

## 2014-08-25 DIAGNOSIS — N907 Vulvar cyst: Secondary | ICD-10-CM | POA: Diagnosis present

## 2014-08-25 HISTORY — DX: Unspecified ovarian cyst, unspecified side: N83.209

## 2014-08-25 LAB — URINALYSIS, ROUTINE W REFLEX MICROSCOPIC
Bilirubin Urine: NEGATIVE
Glucose, UA: NEGATIVE mg/dL
Hgb urine dipstick: NEGATIVE
KETONES UR: NEGATIVE mg/dL
LEUKOCYTES UA: NEGATIVE
NITRITE: NEGATIVE
PH: 5.5 (ref 5.0–8.0)
PROTEIN: NEGATIVE mg/dL
Specific Gravity, Urine: 1.015 (ref 1.005–1.030)
Urobilinogen, UA: 0.2 mg/dL (ref 0.0–1.0)

## 2014-08-25 LAB — POCT PREGNANCY, URINE: PREG TEST UR: NEGATIVE

## 2014-08-25 MED ORDER — SULFAMETHOXAZOLE-TRIMETHOPRIM 800-160 MG PO TABS: 1.0000 | ORAL_TABLET | Freq: Two times a day (BID) | ORAL | Status: AC

## 2014-08-25 MED ORDER — OXYCODONE-ACETAMINOPHEN 5-325 MG PO TABS: 1.0000 | ORAL_TABLET | Freq: Four times a day (QID) | ORAL | Status: DC | PRN

## 2014-08-25 NOTE — MAU Note (Signed)
Been having these cysts for yrs- since 2003. Has never bother her until the past few days.  It has gotten bigger, hardly slept last night.  Can't sit straight. Becoming more of a problem

## 2014-08-25 NOTE — Discharge Instructions (Signed)
Bartholin's Cyst or Abscess °Bartholin's glands are small glands located within the folds of skin (labia) along the sides of the lower opening of the vagina (birth canal). A cyst may develop when the duct of the gland becomes blocked. When this happens, fluid that accumulates within the cyst can become infected. This is known as an abscess. The Bartholin gland produces a mucous fluid to lubricate the outside of the vagina during sexual intercourse. °SYMPTOMS  °· Patients with a small cyst may not have any symptoms. °· Mild discomfort to severe pain depending on the size of the cyst and if it is infected (abscess). °· Pain, redness, and swelling around the lower opening of the vagina. °· Painful intercourse. °· Pressure in the perineal area. °· Swelling of the lips of the vagina (labia). °· The cyst or abscess can be on one side or both sides of the vagina. °DIAGNOSIS  °· A large swelling is seen in the lower vagina area by your caregiver. °· Painful to touch. °· Redness and pain, if it is an abscess. °TREATMENT  °· Sometimes the cyst will go away on its own. °· Apply warm wet compresses to the area or take hot sitz baths several times a day. °· An incision to drain the cyst or abscess with local anesthesia. °· Culture the pus, if it is an abscess. °· Antibiotic treatment, if it is an abscess. °· Cut open the gland and suture the edges to make the opening of the gland bigger (marsupialization). °· Remove the whole gland if the cyst or abscess returns. °PREVENTION  °· Practice good hygiene. °· Clean the vaginal area with a mild soap and soft cloth when bathing. °· Do not rub hard in the vaginal area when bathing. °· Protect the crotch area with a padded cushion if you take long bike rides or ride horses. °· Be sure you are well lubricated when you have sexual intercourse. °HOME CARE INSTRUCTIONS  °· If your cyst or abscess was opened, a small piece of gauze, or a drain, may have been placed in the wound to allow  drainage. Do not remove this gauze or drain unless directed by your caregiver. °· Wear feminine pads, not tampons, as needed for any drainage or bleeding. °· If antibiotics were prescribed, take them exactly as directed. Finish the entire course. °· Only take over-the-counter or prescription medicines for pain, discomfort, or fever as directed by your caregiver. °SEEK IMMEDIATE MEDICAL CARE IF:  °· You have an increase in pain, redness, swelling, or drainage. °· You have bleeding from the wound which results in the use of more than the number of pads suggested by your caregiver in 24 hours. °· You have chills. °· You have a fever. °· You develop any new problems (symptoms) or aggravation of your existing condition. °MAKE SURE YOU:  °· Understand these instructions. °· Will watch your condition. °· Will get help right away if you are not doing well or get worse. °Document Released: 05/05/2005 Document Revised: 07/28/2011 Document Reviewed: 12/22/2007 °[employer]® Patient Information ©2015 [employer]. This information is not intended to replace advice given to you by your health care provider. Make sure you discuss any questions you have with your health care provider. ° °

## 2014-08-25 NOTE — MAU Provider Note (Signed)
History     CSN: [id number]  Arrival date and time: 08/25/14 1617   First Provider Initiated Contact with Patient 08/25/14 1707      No chief complaint on file.  HPI  Ms. Janice Bautista is a 39 y.o. [phone] [id number] who presents to MAU today with complaint of a labial cyst. She states that this cyst in the area of the bartholin's gland has been present since the birth of her son in 2003. She states that it never bothered her, but became larger and painful ~ 7 days ago. She has taken Aleve with minimal relief. She denies drainage, bleeding, vaginal discharge, fever or UTI symptoms.   OB History    Gravida Para Term Preterm AB TAB SAB Ectopic Multiple Living   5 2 1  3 2 1   2       Past Medical History  Diagnosis Date  . Ovarian cyst     Past Surgical History  Procedure Laterality Date  . No past surgeries      No family history on file.  History  Substance Use Topics  . Smoking status: Never Smoker   . Smokeless tobacco: Not on file  . Alcohol Use: No    Allergies:  Allergies  Allergen Reactions  . Other Hives and Swelling    Seafood    No prescriptions prior to admission    Review of Systems  Constitutional: Negative for fever and malaise/fatigue.  Gastrointestinal: Positive for abdominal pain.  Genitourinary: Negative for dysuria, urgency and frequency.       Neg - vaginal bleeding, discharge   Physical Exam   Blood pressure 120/70, pulse 88, temperature 98.1 F (36.7 C), temperature source Oral, resp. rate 18, last menstrual period 08/06/2014.  Physical Exam  Constitutional: She is oriented to person, place, and time. She appears well-developed and well-nourished. No distress.  HENT:  Head: Normocephalic.  Cardiovascular: Normal rate.   Respiratory: Effort normal.  GI: Soft. Bowel sounds are normal. She exhibits no distension and no mass. There is no tenderness. There is no rebound and no guarding.  Genitourinary:    There is no rash, tenderness, lesion  or injury on the right labia. There is tenderness on the left labia. There is no rash, lesion or injury on the left labia.  Neurological: She is alert and oriented to person, place, and time.  Skin: Skin is warm and dry. No erythema.  Psychiatric: She has a normal mood and affect.   Results for orders placed or performed during the hospital encounter of 08/25/14 (from the past 24 hour(s))  Urinalysis, Routine w reflex microscopic     Status: None   Collection Time: 08/25/14  4:45 PM  Result Value Ref Range   Color, Urine YELLOW YELLOW   APPearance CLEAR CLEAR   Specific Gravity, Urine 1.015 1.005 - 1.030   pH 5.5 5.0 - 8.0   Glucose, UA NEGATIVE NEGATIVE mg/dL   Hgb urine dipstick NEGATIVE NEGATIVE   Bilirubin Urine NEGATIVE NEGATIVE   Ketones, ur NEGATIVE NEGATIVE mg/dL   Protein, ur NEGATIVE NEGATIVE mg/dL   Urobilinogen, UA 0.2 0.0 - 1.0 mg/dL   Nitrite NEGATIVE NEGATIVE   Leukocytes, UA NEGATIVE NEGATIVE  Pregnancy, urine POC     Status: None   Collection Time: 08/25/14  5:04 PM  Result Value Ref Range   Preg Test, Ur NEGATIVE NEGATIVE    MAU Course  Procedures None  MDM UPT - negative UA today Discussed options of antibiotic therapy in  combination with hot compresses vs I&D today in MAU Patient prefers antibiotics and hot compresses at this time  Assessment and Plan  A: Bartholin's gland cyst  P: Discharge home Rx for Bactrim and Percocet given to patient Patient advised to use hot compresses to the area to promote drainage 2-4 times/day Patient advised to return to MAU for I&D if swelling and/or pain increased significantly Patient may return to MAU as needed or if her condition were to change or worsen   Marny Lowenstein, PA-C  08/25/2014, 6:05 PM

## 2014-08-28 ENCOUNTER — Ambulatory Visit: Payer: Self-pay | Admitting: Internal Medicine

## 2014-09-22 ENCOUNTER — Ambulatory Visit (INDEPENDENT_AMBULATORY_CARE_PROVIDER_SITE_OTHER): Payer: 59 | Admitting: Internal Medicine

## 2014-09-22 ENCOUNTER — Encounter: Payer: Self-pay | Admitting: Internal Medicine

## 2014-09-22 ENCOUNTER — Ambulatory Visit: Payer: Self-pay | Admitting: Internal Medicine

## 2014-09-22 VITALS — BP 120/84 | HR 82 | Ht 64.0 in | Wt 205.2 lb

## 2014-09-22 DIAGNOSIS — Z8249 Family history of ischemic heart disease and other diseases of the circulatory system: Secondary | ICD-10-CM

## 2014-09-22 LAB — LIPID PANEL
Cholesterol: 135 mg/dL (ref 0–200)
HDL: 44.3 mg/dL (ref >39.00)
LDL CALC: 64 mg/dL (ref 0–99)
NONHDL: 90.7
Total CHOL/HDL Ratio: 3
Triglycerides: 134 mg/dL (ref 0.0–149.0)
VLDL: 26.8 mg/dL (ref 0.0–40.0)

## 2014-09-22 NOTE — Progress Notes (Signed)
Cardiology Office Note   Date:  09/22/2014   ID:  Janice Bautista, DOB March 17, 1976, MRN [id number]  PCP:  No primary care provider on file.  Cardiologist:   Dietrich PatesPaula Senya Hinzman, MD   No chief complaint on file. Patient presents for cardiac evaluation  Strong fhx of CAD     History of Present Illness: Janice Bautista is a 39 y.o. female with no known history of heart disease.  Her father had signif CAD/PVOD.  Also his side to hte family  Her Siblings have been screend an do not have signif CAD  She has had a cough  Does have allergies   Denies CP  No signif SOB      Current Outpatient Prescriptions  Medication Sig Dispense Refill  . Cholecalciferol (VITAMIN D PO) Take 1 tablet by mouth daily.    . Coenzyme Q10 (CO Q 10) 10 MG CAPS Take 1 tablet by mouth daily.    Marland Kitchen. MAGNESIUM PO Take 2 tablets by mouth daily.    . Omega-3 Fatty Acids (FISH OIL PO) Take 1 tablet by mouth daily.     No current facility-administered medications for this visit.    Allergies:   Other   Past Medical History  Diagnosis Date  . Ovarian cyst     Past Surgical History  Procedure Laterality Date  . No past surgeries       Social History:  The patient  reports that she has never smoked. She does not have any smokeless tobacco history on file. She reports that she does not drink alcohol or use illicit drugs.   Family History:  The patient's family history includes Heart failure in her father.    ROS:  Please see the history of present illness. All other systems are reviewed and  Negative to the above problem except as noted.    PHYSICAL EXAM: VS:  BP 120/84 mmHg  Pulse 82  Ht 5\' 4"  (1.626 m)  Wt 205 lb 3.2 oz (93.078 kg)  BMI 35.21 kg/m2  LMP 08/06/2014  GEN: Well nourished, well developed, in no acute distress HEENT: normal Neck: no JVD, carotid bruits, or masses Cardiac: RRR; no murmurs, rubs, or gallops,no edema  Respiratory:  clear to auscultation bilaterally, normal work of breathing GI:  soft, nontender, nondistended, + BS  No hepatomegaly  MS: no deformity Moving all extremities   Skin: warm and dry, no rash Neuro:  Strength and sensation are intact Psych: euthymic mood, full affect   EKG:  EKG is ordered today.  SR 82 bpm     Lipid Panel No results found for: CHOL, TRIG, HDL, CHOLHDL, VLDL, LDLCALC, LDLDIRECT    Wt Readings from Last 3 Encounters:  09/22/14 205 lb 3.2 oz (93.078 kg)      ASSESSMENT AND PLAN:  1.  Cardiac risk assessment.  Patient does not have any sympotms to suggest active dz.  Will find out what her lipids are  To further risk stratify.   F/u will be based on this.     Current medicines are reviewed at length with the patient today.  The patient does not have concerns regarding medicines.  The following changes have been made:   Labs/ tests ordered today include: No orders of the defined types were placed in this encounter.     Disposition:   FU with me depends on test results     Signed, Dietrich PatesPaula Sophya Vanblarcom, MD  09/22/2014 12:10 PM    [facility]  [place], [place], [place]  [address] Phone: [phone]; Fax: [phone]

## 2014-09-22 NOTE — Patient Instructions (Signed)
Follow up with Dr. Tenny Crawoss as needed.   We will request your records from Dr. Melburn HakeBeth Bautista's office.

## 2014-10-09 ENCOUNTER — Ambulatory Visit: Payer: Self-pay | Admitting: Internal Medicine

## 2015-01-02 ENCOUNTER — Encounter: Payer: Self-pay | Admitting: *Deleted

## 2015-01-02 ENCOUNTER — Ambulatory Visit (INDEPENDENT_AMBULATORY_CARE_PROVIDER_SITE_OTHER): Payer: 59 | Admitting: Family Medicine

## 2015-01-02 VITALS — BP 112/74 | HR 65 | Temp 97.9°F | Resp 16 | Ht 64.5 in | Wt 201.0 lb

## 2015-01-02 DIAGNOSIS — R1013 Epigastric pain: Secondary | ICD-10-CM

## 2015-01-02 DIAGNOSIS — R112 Nausea with vomiting, unspecified: Secondary | ICD-10-CM | POA: Diagnosis not present

## 2015-01-02 MED ORDER — ONDANSETRON 4 MG PO TBDP
4.0000 mg | ORAL_TABLET | Freq: Once | ORAL | Status: AC
Start: 2015-01-02 — End: 2015-01-02
  Administered 2015-01-02: 4 mg via ORAL

## 2015-01-02 MED ORDER — ONDANSETRON 4 MG PO TBDP: 4.0000 mg | ORAL_TABLET | Freq: Three times a day (TID) | ORAL | Status: DC | PRN

## 2015-01-02 NOTE — Progress Notes (Signed)
Subjective:   This chart was scribed for Janice Staggers, MD by Jarvis Morgan, Medical Scribe. This patient was seen in Room 5 and the patient's care was started at 5:38 PM.   Patient ID: Janice Bautista, female    DOB: Dec 29, 1975, 39 y.o.   MRN: [id number]  Chief Complaint  Patient presents with  . Emesis    x 1 day  . Abdominal Pain    HPI Janice Bautista is a 39 y.o. female   Pt is complaining of intermittent, mild, emesis onset 2 days and began at 1:00 PM yesterday. She states she has had 3 episodes of emesis today. She had 2 episodes yesterday and 2x during the night. She reports associated nausea and RUQ abdominal pain. She states the abdominal pain is relieved by vomiting. She is unable to tolerate liquids or solids.She took Pepto Bismol for the vomiting and nausea with no relief. She took Tylenol for her HA with mild relief. She denies any recent travel.  She is still urinating and denies urine being dark in color. Pt had her gallbladder removed in 2013. Her last bowel movement was 2 days ago. She denies any h/o elevated cholesterol. She denies any h/o of ETOH abuse. Pt denies any known sick contacts but does work in [employer], music. She denies eating any new or undercooked foods. She denies any fever, urinary symptoms, or diarrhea.   Review of Systems  Constitutional: Negative for fever and chills.  Gastrointestinal: Positive for nausea, vomiting and abdominal pain. Negative for diarrhea.  Genitourinary: Negative for dysuria, urgency, frequency, hematuria, flank pain and difficulty urinating.       Objective:   Physical Exam  Constitutional: She is oriented to person, place, and time. She appears well-developed and well-nourished. No distress.  HENT:  Head: Normocephalic and atraumatic.  Eyes: Conjunctivae and EOM are normal.  Neck: Neck supple. No tracheal deviation present.  Cardiovascular: Normal rate, regular rhythm and normal heart sounds.  Exam reveals no gallop and no  friction rub.   No murmur heard. Pulmonary/Chest: Effort normal and breath sounds normal. No respiratory distress. She has no wheezes. She has no rales.  Abdominal: There is tenderness in the epigastric area. There is no tenderness at McBurney's point and negative Murphy's sign.  Musculoskeletal: Normal range of motion.  Neurological: She is alert and oriented to person, place, and time.  Skin: Skin is warm and dry.  Psychiatric: She has a normal mood and affect. Her behavior is normal.  Nursing note and vitals reviewed.    Filed Vitals:   01/02/15 1626  BP: 112/74  Pulse: 65  Temp: 97.9 F (36.6 C)  Resp: 16  Height: 5' 4.5" (1.638 m)  Weight: 201 lb (91.173 kg)  SpO2: 98%      Assessment & Plan:   MALLY GAVINA is a 39 y.o. female Non-intractable vomiting with nausea, vomiting of unspecified type - Plan: ondansetron (ZOFRAN-ODT) disintegrating tablet 4 mg, ondansetron (ZOFRAN ODT) 4 MG disintegrating tablet  Abdominal pain, epigastric  suspected viral gastroenteritis,,  Without apparent dehydration/volume depletion in office.  - symptomatic care with Zofran given in office and prescription for home, frequent sips of small fluids, and other instructions per AVS.  - RTC precautions if worsening, or unable to  Maintain hydration at home.  Meds ordered this encounter  Medications  . ondansetron (ZOFRAN-ODT) disintegrating tablet 4 mg    Sig:   . ondansetron (ZOFRAN ODT) 4 MG disintegrating tablet    Sig: Take 1  tablet (4 mg total) by mouth every 8 (eight) hours as needed for nausea or vomiting.    Dispense:  10 tablet    Refill:  0   Patient Instructions  Based on your exam in the office, and her vital signs, I suspect you have picked up a viral infection of your stomach. This may also proceed on to some diarrhea. He can take Zofran as discussed to help with nausea and vomiting, small sips of fluids frequently tonight to lessen chance of dehydration, but if you're not  improving into tomorrow or worsening overnight return here or the emergency room.   Abdominal Pain Many things can cause abdominal pain. Usually, abdominal pain is not caused by a disease and will improve without treatment. It can often be observed and treated at home. Your health care provider will do a physical exam and possibly order blood tests and X-rays to help determine the seriousness of your pain. However, in many cases, more time must pass before a clear cause of the pain can be found. Before that point, your health care provider may not know if you need more testing or further treatment. HOME CARE INSTRUCTIONS  Monitor your abdominal pain for any changes. The following actions may help to alleviate any discomfort you are experiencing:  Only take over-the-counter or prescription medicines as directed by your health care provider.  Do not take laxatives unless directed to do so by your health care provider.  Try a clear liquid diet (broth, tea, or water) as directed by your health care provider. Slowly move to a bland diet as tolerated. SEEK MEDICAL CARE IF:  You have unexplained abdominal pain.  You have abdominal pain associated with nausea or diarrhea.  You have pain when you urinate or have a bowel movement.  You experience abdominal pain that wakes you in the night.  You have abdominal pain that is worsened or improved by eating food.  You have abdominal pain that is worsened with eating fatty foods.  You have a fever. SEEK IMMEDIATE MEDICAL CARE IF:   Your pain does not go away within 2 hours.  You keep throwing up (vomiting).  Your pain is felt only in portions of the abdomen, such as the right side or the left lower portion of the abdomen.  You pass bloody or black tarry stools. MAKE SURE YOU:  Understand these instructions.   Will watch your condition.   Will get help right away if you are not doing well or get worse.  Document Released: 02/12/2005  Document Revised: 05/10/2013 Document Reviewed: 01/12/2013 [facility] Patient Information 2015 [place], [place]. This information is not intended to replace advice given to you by your health care provider. Make sure you discuss any questions you have with your health care provider.   Nausea and Vomiting Nausea is a sick feeling that often comes before throwing up (vomiting). Vomiting is a reflex where stomach contents come out of your mouth. Vomiting can cause severe loss of body fluids (dehydration). Children and elderly adults can become dehydrated quickly, especially if they also have diarrhea. Nausea and vomiting are symptoms of a condition or disease. It is important to find the cause of your symptoms. CAUSES   Direct irritation of the stomach lining. This irritation can result from increased acid production (gastroesophageal reflux disease), infection, food poisoning, taking certain medicines (such as nonsteroidal anti-inflammatory drugs), alcohol use, or tobacco use.   Signals from the brain.These signals could be caused by a headache, heat exposure,  an inner ear disturbance, increased pressure in the brain from injury, infection, a tumor, or a concussion, pain, emotional stimulus, or metabolic problems.   An obstruction in the gastrointestinal tract (bowel obstruction).   Illnesses such as diabetes, hepatitis, gallbladder problems, appendicitis, kidney problems, cancer, sepsis, atypical symptoms of a heart attack, or eating disorders.   Medical treatments such as chemotherapy and radiation.   Receiving medicine that makes you sleep (general anesthetic) during surgery.  DIAGNOSIS Your caregiver may ask for tests to be done if the problems do not improve after a few days. Tests may also be done if symptoms are severe or if the reason for the nausea and vomiting is not clear. Tests may include:  Urine tests.   Blood tests.   Stool tests.   Cultures (to look for evidence of infection).    X-rays or other imaging studies.  Test results can help your caregiver make decisions about treatment or the need for additional tests. TREATMENT You need to stay well hydrated. Drink frequently but in small amounts.You may wish to drink water, sports drinks, clear broth, or eat frozen ice pops or gelatin dessert to help stay hydrated.When you eat, eating slowly may help prevent nausea.There are also some antinausea medicines that may help prevent nausea. HOME CARE INSTRUCTIONS   Take all medicine as directed by your caregiver.   If you do not have an appetite, do not force yourself to eat. However, you must continue to drink fluids.   If you have an appetite, eat a normal diet unless your caregiver tells you differently.   Eat a variety of complex carbohydrates (rice, wheat, potatoes, bread), lean meats, yogurt, fruits, and vegetables.   Avoid high-fat foods because they are more difficult to digest.   Drink enough water and fluids to keep your urine clear or pale yellow.   If you are dehydrated, ask your caregiver for specific rehydration instructions. Signs of dehydration may include:   Severe thirst.   Dry lips and mouth.   Dizziness.   Dark urine.   Decreasing urine frequency and amount.   Confusion.   Rapid breathing or pulse.  SEEK IMMEDIATE MEDICAL CARE IF:   You have blood or brown flecks (like coffee grounds) in your vomit.   You have black or bloody stools.   You have a severe headache or stiff neck.   You are confused.   You have severe abdominal pain.   You have chest pain or trouble breathing.   You do not urinate at least once every 8 hours.   You develop cold or clammy skin.   You continue to vomit for longer than 24 to 48 hours.   You have a fever.  MAKE SURE YOU:   Understand these instructions.   Will watch your condition.   Will get help right away if you are not doing well or get worse.  Document Released: 05/05/2005 Document  Revised: 04/24/2011 Document Reviewed: 10/02/2010 [facility] Patient Information 2012 [place], [place].  Return to the clinic or go to the nearest emergency room if any of your symptoms worsen or new symptoms occur.      I personally performed the services described in this documentation, which was scribed in my presence. The recorded information has been reviewed and considered, and addended by me as needed.

## 2015-01-02 NOTE — Patient Instructions (Signed)
Based on your exam in the office, and her vital signs, I suspect you have picked up a viral infection of your stomach. This may also proceed on to some diarrhea. He can take Zofran as discussed to help with nausea and vomiting, small sips of fluids frequently tonight to lessen chance of dehydration, but if you're not improving into tomorrow or worsening overnight return here or the emergency room.   Abdominal Pain Many things can cause abdominal pain. Usually, abdominal pain is not caused by a disease and will improve without treatment. It can often be observed and treated at home. Your health care provider will do a physical exam and possibly order blood tests and X-rays to help determine the seriousness of your pain. However, in many cases, more time must pass before a clear cause of the pain can be found. Before that point, your health care provider may not know if you need more testing or further treatment. HOME CARE INSTRUCTIONS  Monitor your abdominal pain for any changes. The following actions may help to alleviate any discomfort you are experiencing:  Only take over-the-counter or prescription medicines as directed by your health care provider.  Do not take laxatives unless directed to do so by your health care provider.  Try a clear liquid diet (broth, tea, or water) as directed by your health care provider. Slowly move to a bland diet as tolerated. SEEK MEDICAL CARE IF:  You have unexplained abdominal pain.  You have abdominal pain associated with nausea or diarrhea.  You have pain when you urinate or have a bowel movement.  You experience abdominal pain that wakes you in the night.  You have abdominal pain that is worsened or improved by eating food.  You have abdominal pain that is worsened with eating fatty foods.  You have a fever. SEEK IMMEDIATE MEDICAL CARE IF:   Your pain does not go away within 2 hours.  You keep throwing up (vomiting).  Your pain is felt only in  portions of the abdomen, such as the right side or the left lower portion of the abdomen.  You pass bloody or black tarry stools. MAKE SURE YOU:  Understand these instructions.   Will watch your condition.   Will get help right away if you are not doing well or get worse.  Document Released: 02/12/2005 Document Revised: 05/10/2013 Document Reviewed: 01/12/2013 [facility] Patient Information 2015 [place], [place]. This information is not intended to replace advice given to you by your health care provider. Make sure you discuss any questions you have with your health care provider.   Nausea and Vomiting Nausea is a sick feeling that often comes before throwing up (vomiting). Vomiting is a reflex where stomach contents come out of your mouth. Vomiting can cause severe loss of body fluids (dehydration). Children and elderly adults can become dehydrated quickly, especially if they also have diarrhea. Nausea and vomiting are symptoms of a condition or disease. It is important to find the cause of your symptoms. CAUSES   Direct irritation of the stomach lining. This irritation can result from increased acid production (gastroesophageal reflux disease), infection, food poisoning, taking certain medicines (such as nonsteroidal anti-inflammatory drugs), alcohol use, or tobacco use.   Signals from the brain.These signals could be caused by a headache, heat exposure, an inner ear disturbance, increased pressure in the brain from injury, infection, a tumor, or a concussion, pain, emotional stimulus, or metabolic problems.   An obstruction in the gastrointestinal tract (bowel obstruction).  Illnesses such as diabetes, hepatitis, gallbladder problems, appendicitis, kidney problems, cancer, sepsis, atypical symptoms of a heart attack, or eating disorders.   Medical treatments such as chemotherapy and radiation.   Receiving medicine that makes you sleep (general anesthetic) during surgery.   DIAGNOSIS Your caregiver may ask for tests to be done if the problems do not improve after a few days. Tests may also be done if symptoms are severe or if the reason for the nausea and vomiting is not clear. Tests may include:  Urine tests.   Blood tests.   Stool tests.   Cultures (to look for evidence of infection).   X-rays or other imaging studies.  Test results can help your caregiver make decisions about treatment or the need for additional tests. TREATMENT You need to stay well hydrated. Drink frequently but in small amounts.You may wish to drink water, sports drinks, clear broth, or eat frozen ice pops or gelatin dessert to help stay hydrated.When you eat, eating slowly may help prevent nausea.There are also some antinausea medicines that may help prevent nausea. HOME CARE INSTRUCTIONS   Take all medicine as directed by your caregiver.   If you do not have an appetite, do not force yourself to eat. However, you must continue to drink fluids.   If you have an appetite, eat a normal diet unless your caregiver tells you differently.   Eat a variety of complex carbohydrates (rice, wheat, potatoes, bread), lean meats, yogurt, fruits, and vegetables.   Avoid high-fat foods because they are more difficult to digest.   Drink enough water and fluids to keep your urine clear or pale yellow.   If you are dehydrated, ask your caregiver for specific rehydration instructions. Signs of dehydration may include:   Severe thirst.   Dry lips and mouth.   Dizziness.   Dark urine.   Decreasing urine frequency and amount.   Confusion.   Rapid breathing or pulse.  SEEK IMMEDIATE MEDICAL CARE IF:   You have blood or brown flecks (like coffee grounds) in your vomit.   You have black or bloody stools.   You have a severe headache or stiff neck.   You are confused.   You have severe abdominal pain.   You have chest pain or trouble breathing.   You do not urinate at least  once every 8 hours.   You develop cold or clammy skin.   You continue to vomit for longer than 24 to 48 hours.   You have a fever.  MAKE SURE YOU:   Understand these instructions.   Will watch your condition.   Will get help right away if you are not doing well or get worse.  Document Released: 05/05/2005 Document Revised: 04/24/2011 Document Reviewed: 10/02/2010 [facility] Patient Information 2012 [place], [place].  Return to the clinic or go to the nearest emergency room if any of your symptoms worsen or new symptoms occur.

## 2015-05-26 ENCOUNTER — Encounter (HOSPITAL_COMMUNITY): Payer: Self-pay | Admitting: Emergency Medicine

## 2015-05-26 ENCOUNTER — Emergency Department (HOSPITAL_COMMUNITY)
Admission: EM | Admit: 2015-05-26 | Discharge: 2015-05-26 | Disposition: A | Discharge status: Home / Self Care | Payer: 59 | Attending: Emergency Medicine | Admitting: Emergency Medicine

## 2015-05-26 DIAGNOSIS — Z79899 Other long term (current) drug therapy: Secondary | ICD-10-CM | POA: Diagnosis not present

## 2015-05-26 DIAGNOSIS — H9202 Otalgia, left ear: Secondary | ICD-10-CM | POA: Insufficient documentation

## 2015-05-26 DIAGNOSIS — J020 Streptococcal pharyngitis: Secondary | ICD-10-CM | POA: Diagnosis not present

## 2015-05-26 DIAGNOSIS — Z8742 Personal history of other diseases of the female genital tract: Secondary | ICD-10-CM | POA: Insufficient documentation

## 2015-05-26 DIAGNOSIS — J029 Acute pharyngitis, unspecified: Secondary | ICD-10-CM | POA: Diagnosis not present

## 2015-05-26 LAB — RAPID STREP SCREEN (MED CTR MEBANE ONLY): Streptococcus, Group A Screen (Direct): NEGATIVE

## 2015-05-26 MED ORDER — AMOXICILLIN-POT CLAVULANATE 875-125 MG PO TABS: 1.0000 | ORAL_TABLET | Freq: Two times a day (BID) | ORAL | Status: DC

## 2015-05-26 MED ORDER — DEXAMETHASONE 4 MG PO TABS
10.0000 mg | ORAL_TABLET | Freq: Once | ORAL | Status: AC
  Administered 2015-05-26: 10 mg via ORAL
  Filled 2015-05-26: qty 2

## 2015-05-26 MED ORDER — KETOROLAC TROMETHAMINE 30 MG/ML IJ SOLN
30.0000 mg | Freq: Once | INTRAMUSCULAR | Status: AC
  Administered 2015-05-26: 30 mg via INTRAMUSCULAR
  Filled 2015-05-26: qty 1

## 2015-05-26 MED ORDER — AMOXICILLIN-POT CLAVULANATE 875-125 MG PO TABS
1.0000 | ORAL_TABLET | Freq: Once | ORAL | Status: AC
  Administered 2015-05-26: 1 via ORAL
  Filled 2015-05-26: qty 1

## 2015-05-26 NOTE — ED Notes (Signed)
Per pt, states sore throat since yesterday 

## 2015-05-26 NOTE — Discharge Instructions (Signed)

## 2015-05-26 NOTE — ED Provider Notes (Signed)
CSN: [id number]     Arrival date & time 05/26/15  0707 History   First MD Initiated Contact with Patient 05/26/15 [phone]     Chief Complaint  Patient presents with  . Sore Throat     (Consider location/radiation/quality/duration/timing/severity/associated sxs/prior Treatment) HPI Comments: 40 year old female with no significant past medical history presents for sore throat. The patient reports this started yesterday. She reports pain with swallowing as well as fullness in her ear. She said most of her pain is on the left side. She reports other coworkers at work complaining of similar issue yesterday. Denies known fever or chills. No nausea or vomiting. No cough or shortness of breath. No change in her voice.   Past Medical History  Diagnosis Date  . Ovarian cyst    Past Surgical History  Procedure Laterality Date  . No past surgeries     Family History  Problem Relation Age of Onset  . Heart failure Father    Social History  Substance Use Topics  . Smoking status: Never Smoker   . Smokeless tobacco: None  . Alcohol Use: No   OB History    Gravida Para Term Preterm AB TAB SAB Ectopic Multiple Living   '5 2 1  3 2 1   2     '$ Review of Systems  Constitutional: Positive for fatigue. Negative for fever, chills, diaphoresis and appetite change.  HENT: Positive for ear pain (left), sinus pressure and sore throat. Negative for congestion, postnasal drip, rhinorrhea, trouble swallowing (although pain ful) and voice change.   Eyes: Negative for pain and redness.  Respiratory: Negative for cough, chest tightness, shortness of breath and wheezing.   Cardiovascular: Negative for chest pain and palpitations.  Gastrointestinal: Negative for nausea, vomiting, abdominal pain and diarrhea.  Genitourinary: Negative for dysuria, urgency, frequency and hematuria.  Musculoskeletal: Positive for myalgias. Negative for back pain.  Skin: Negative for rash.  Neurological: Negative for dizziness,  weakness and headaches.  Hematological: Does not bruise/bleed easily.      Allergies  Other  Home Medications   Prior to Admission medications   Medication Sig Start Date End Date Taking? Authorizing Provider  amoxicillin-clavulanate (AUGMENTIN) 875-125 MG tablet Take 1 tablet by mouth 2 (two) times daily. 05/26/15   Harvel Quale, MD  Cholecalciferol (VITAMIN D PO) Take 1 tablet by mouth daily.    Historical Provider, MD  Coenzyme Q10 (CO Q 10) 10 MG CAPS Take 1 tablet by mouth daily.    Historical Provider, MD  MAGNESIUM PO Take 2 tablets by mouth daily.    Historical Provider, MD  Omega-3 Fatty Acids (FISH OIL PO) Take 1 tablet by mouth daily.    Historical Provider, MD  ondansetron (ZOFRAN ODT) 4 MG disintegrating tablet Take 1 tablet (4 mg total) by mouth every 8 (eight) hours as needed for nausea or vomiting. 01/02/15   Wendie Agreste, MD   BP 138/86 mmHg  Pulse 104  Temp(Src) 98.5 F (36.9 C) (Oral)  Resp 16  SpO2 100%  LMP 05/05/2015 Physical Exam  Constitutional: She is oriented to person, place, and time. She appears well-developed and well-nourished.  Non-toxic appearance. She does not have a sickly appearance. No distress.  HENT:  Head: Normocephalic and atraumatic.  Right Ear: Tympanic membrane and external ear normal. Tympanic membrane is not injected, not erythematous, not retracted and not bulging.  Left Ear: External ear normal. Tympanic membrane is injected and erythematous. Tympanic membrane is not retracted and not bulging.  Nose:  Nose normal.  Mouth/Throat: Mucous membranes are normal. Oropharyngeal exudate, posterior oropharyngeal edema and posterior oropharyngeal erythema present. No tonsillar abscesses.  Eyes: EOM are normal. Pupils are equal, round, and reactive to light.  Neck: Normal range of motion, full passive range of motion without pain and phonation normal. Neck supple. No tracheal tenderness present. No rigidity. No tracheal deviation present.   Cardiovascular: Regular rhythm, normal heart sounds and intact distal pulses.   No murmur heard. Pulmonary/Chest: Effort normal. No stridor. No respiratory distress. She has no wheezes. She has no rales.  Abdominal: Soft. She exhibits no distension. There is no tenderness.  Musculoskeletal: Normal range of motion. She exhibits no edema or tenderness.  Lymphadenopathy:    She has cervical adenopathy.  Neurological: She is alert and oriented to person, place, and time.  Skin: Skin is warm and dry. No rash noted. She is not diaphoretic.  Vitals reviewed.   ED Course  Procedures (including critical care time) Labs Review Labs Reviewed  RAPID STREP SCREEN (NOT AT [facility])  CULTURE, GROUP A STREP    Imaging Review No results found. I have personally reviewed and evaluated these images and lab results as part of my medical decision-making.   EKG Interpretation None      MDM  Patient was seen and evaluated in stable condition.  Patient well-appearing and nontoxic. Normal phonation. Able to fully open mouth. Patient without cough. Cervical lymphadenopathy present. Clinically patient met Centor criteria for strep pharyngitis. Patient was given Decadron and Toradol. She was discharged with a prescription for Augmentin. She was given strict return precautions and told to follow-up outpatient. Final diagnoses:  Pharyngitis    1. Pharyngitis, clinically strep pharyngitis    Harvel Quale, MD 05/26/15 [phone]

## 2015-05-28 LAB — CULTURE, GROUP A STREP: Strep A Culture: POSITIVE — AB

## 2015-05-30 ENCOUNTER — Telehealth (HOSPITAL_COMMUNITY): Payer: Self-pay

## 2015-05-30 NOTE — Telephone Encounter (Signed)
Post ED Visit - Positive Culture Follow-up  Culture report reviewed by antimicrobial stewardship pharmacist:  []  Enzo BiNathan Batchelder, Pharm.D. []  Celedonio MiyamotoJeremy Frens, Pharm.D., BCPS []  Garvin FilaMike Maccia, Pharm.D. []  Georgina PillionElizabeth Martin, Pharm.D., BCPS []  [place] Haven[place]h Pham, VermontPharm.D., BCPS, AAHIVP []  Estella HuskMichelle Turner, Pharm.D., BCPS, AAHIVP []  Tennis Mustassie Stewart, Pharm.D. []  Rob Oswaldo DoneVincent, [address] Rainbow BoulevardPharm.Jacinto Reap. X  Aubrey Jones, Pharm.D.  Positive throat culture, Group A Strep Treated with Amoxicillin, organism sensitive to the same and no further patient follow-up is required at this time.  Arvid RightClark, Corydon Schweiss Dorn 05/30/2015, 11:00 AM

## 2015-06-17 DIAGNOSIS — H5203 Hypermetropia, bilateral: Secondary | ICD-10-CM | POA: Diagnosis not present

## 2018-05-17 ENCOUNTER — Other Ambulatory Visit: Payer: Self-pay | Admitting: Nurse Practitioner

## 2018-05-17 DIAGNOSIS — Z1231 Encounter for screening mammogram for malignant neoplasm of breast: Secondary | ICD-10-CM

## 2018-07-22 DIAGNOSIS — J069 Acute upper respiratory infection, unspecified: Secondary | ICD-10-CM | POA: Diagnosis not present

## 2022-02-11 ENCOUNTER — Ambulatory Visit
Admission: EM | Admit: 2022-02-11 | Discharge: 2022-02-11 | Disposition: A | Discharge status: Home / Self Care | Payer: PRIVATE HEALTH INSURANCE | Attending: Urgent Care | Admitting: Urgent Care

## 2022-02-11 DIAGNOSIS — F411 Generalized anxiety disorder: Secondary | ICD-10-CM | POA: Diagnosis not present

## 2022-02-11 MED ORDER — BUSPIRONE HCL 5 MG PO TABS: 5.0000 mg | ORAL_TABLET | Freq: Three times a day (TID) | ORAL | 1 refills | Status: DC

## 2022-02-11 MED ORDER — ALPRAZOLAM 0.5 MG PO TABS: 0.5000 mg | ORAL_TABLET | Freq: Every evening | ORAL | 0 refills | Status: AC | PRN

## 2022-02-11 MED ORDER — ALPRAZOLAM 0.5 MG PO TABS: 0.5000 mg | ORAL_TABLET | Freq: Every evening | ORAL | 0 refills | Status: DC | PRN

## 2022-02-11 MED ORDER — HYDROXYZINE HCL 25 MG PO TABS: 12.5000 mg | ORAL_TABLET | Freq: Three times a day (TID) | ORAL | 0 refills | Status: DC | PRN

## 2022-02-11 NOTE — Discharge Instructions (Addendum)
I would like for you to start taking buspirone 3 times daily.  This is an anxiety medicine is very classic and generally does not cause sedation, drowsiness or sleepiness.  If you are at work and feel very overwhelmed we can try a different kind of anxiety medicine called hydroxyzine.  You would not stop taking your buspirone.  You would just add this in while you are at work.  You can take a 1/2 to 1 full tablet of hydroxyzine up to 3 times daily or every 8 hours.  This medication can make you sleepy so it may impact you at work.  Still if you are experiencing significant anxiety at work it would be worth it to try at least a half a tablet.  For the next 2 nights I would like for you to use alprazolam to help you get some sleep.  Thereafter I would prefer you try hydroxyzine instead.  Alprazolam is an addictive medicine and definitely can make you sleepy or give you a drunk type feeling so I do not recommend you taking this while you are at work.  Let me know when your employer gets back to you about your note for work.

## 2022-02-11 NOTE — ED Provider Notes (Signed)
[facility]  Note:  This document was prepared using Conservation officer, historic buildings and may include unintentional dictation errors.  MRN: [id number] DOB: August 05, 1975  Subjective:   Janice Bautista is a 46 y.o. female presenting for concerns about significant stress over the past 3 weeks.  Patient has had restlessness, agitation, difficulty with her sleep, decreased appetite.  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  She was supposed to be working from home.  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  This has been tremendously stressful to the patient.  Has never previously had any thoughts of anxiety or depression.  Currently does not endorse any suicidal or homicidal ideation.  She would like help figuring out how to manage her anxiety.  Unfortunately, right now she does not have a plan in place to go into another work position.  She discussed this with her employer and her employer is requiring a note for her work.  The primary goal for the patient right now is to switch to her promise to work from home position.  No alcohol use, drug use, smoking.  Patient is otherwise very healthy.  No current facility-administered medications for this encounter.  Current Outpatient Medications:    Cholecalciferol (VITAMIN D PO), Take 1 tablet by mouth daily., Disp: , Rfl:    Coenzyme Q10 (CO Q 10) 10 MG CAPS, Take 1 tablet by mouth daily., Disp: , Rfl:    MAGNESIUM PO, Take 2 tablets by mouth daily., Disp: , Rfl:    Omega-3 Fatty Acids (FISH OIL PO), Take 1 tablet by mouth daily., Disp: , Rfl:    Allergies  Allergen Reactions   Other Hives and Swelling    Seafood    Past Medical History:  Diagnosis Date   Ovarian cyst      Past Surgical History:  Procedure Laterality Date   NO PAST SURGERIES      Family History  Problem  Relation Age of Onset   Heart failure Father     Social History   Tobacco Use   Smoking status: Never  Substance Use Topics   Alcohol use: No   Drug use: No    ROS   Objective:   Vitals: BP 122/83   Pulse 87   Temp 98.2 F (36.8 C)   Resp 18   LMP 01/13/2022   SpO2 98%   Physical Exam Constitutional:      General: She is not in acute distress.    Appearance: Normal appearance. She is well-developed. She is not ill-appearing, toxic-appearing or diaphoretic.  HENT:     Head: Normocephalic and atraumatic.     Nose: Nose normal.     Mouth/Throat:     Mouth: Mucous membranes are moist.  Eyes:     General: No scleral icterus.       Right eye: No discharge.        Left eye: No discharge.     Extraocular Movements: Extraocular movements intact.  Cardiovascular:     Rate and Rhythm: Normal rate.  Pulmonary:     Effort: Pulmonary effort is normal.  Skin:    General: Skin is warm and dry.  Neurological:     General: No focal deficit present.     Mental Status: She is alert and oriented to person, place, and time.  Psychiatric:        Attention and Perception: Attention and perception normal. She is attentive. She does not perceive auditory or visual hallucinations.        Mood and Affect: Mood is anxious. Mood is not depressed or elated. Affect is flat. Affect is not labile, blunt, angry, tearful or inappropriate.        Speech: She is communicative. Speech is rapid and pressured. Speech is not delayed, slurred or tangential.        Behavior: Behavior normal. Behavior is not agitated, slowed, aggressive, withdrawn, hyperactive or combative.        Thought Content: Thought content normal. Thought content is not paranoid or delusional. Thought content does not include homicidal or suicidal ideation. Thought content does not include homicidal or suicidal plan.        Cognition and Memory: Cognition and memory normal. Cognition is not impaired. Memory is not impaired. She  does not exhibit impaired recent memory or impaired remote memory.        Judgment: Judgment normal. Judgment is not impulsive or inappropriate.     Assessment and Plan :   PDMP not reviewed this encounter.  1. Anxiety state     Patient is under significant anxiety and stress which is by enlarge work related.  No history of mental health issues.  I encouraged her to continue efforts at finding a different work position or leaving her current job altogether.  I offered a note for work but patient declines this.  She plans on discussing with her manager what type of known she would need from me in order to switch to her promised at home work position.  I am willing to provide this and explained to the patient that unfortunately if it involves FMLA and short-term disability I would not be able to complete it.  Patient verbalizes understanding.  I do not believe that she is a threat to herself or others at this time.  She is willing to trial anxiety medications and therefore given the acute nature of her symptoms recommended starting buspirone 3 times daily together with hydroxyzine as needed.  Use alprazolam for overwhelming anxiety and panic attacks.  Discussed the nature of this controlled substance medication and appropriate uses.  Patient verbalizes understanding.  She will get back to me over the next few days about what kind of note for work she needs. Counseled patient on potential for adverse effects with medications prescribed/recommended today, ER and return-to-clinic precautions discussed, patient verbalized understanding.    Jaynee Eagles, PA-C 02/12/22 1034

## 2022-02-11 NOTE — ED Triage Notes (Signed)
Pt presents with complaints of feeling really stressed out for the last 3 weeks. Reports headache and decrease sleep. She recently started a new job and is not sure if that is what it is. Feels like she needs some time off work.

## 2022-03-04 ENCOUNTER — Ambulatory Visit
Admission: EM | Admit: 2022-03-04 | Discharge: 2022-03-04 | Disposition: A | Discharge status: Home / Self Care | Payer: PRIVATE HEALTH INSURANCE | Attending: Emergency Medicine | Admitting: Emergency Medicine

## 2022-03-04 DIAGNOSIS — R079 Chest pain, unspecified: Secondary | ICD-10-CM | POA: Diagnosis not present

## 2022-03-04 DIAGNOSIS — F418 Other specified anxiety disorders: Secondary | ICD-10-CM | POA: Diagnosis not present

## 2022-03-04 DIAGNOSIS — R0789 Other chest pain: Secondary | ICD-10-CM

## 2022-03-04 NOTE — ED Provider Notes (Signed)
[facility]    CSN: [id number] Arrival date & time: 03/04/22  1708    HISTORY   Chief Complaint  Patient presents with   Chest Pain   HPI Janice Bautista is a pleasant, 46 y.o. female who presents to urgent care today. Patient c/o nagging sensation (intermittent) in her chest, that started about a week ago. The patient denies SOB and weakness. Patient is a&o x 4.  Home interventions: coQ-10, asa.  Patient reports history of anxiety and significant stress at her job, patient was seen at this location on February 11, 2022 for similar complaints without the chest discomfort, was prescribed alprazolam, BuSpar and hydroxyzine for anxiety.  Vital signs are normal on arrival today.  The history is provided by the patient.   Past Medical History:  Diagnosis Date   Ovarian cyst    There are no problems to display for this patient.  Past Surgical History:  Procedure Laterality Date   NO PAST SURGERIES     OB History     Gravida  5   Para  2   Term  1   Preterm      AB  3   Living  2      SAB  1   IAB  2   Ectopic      Multiple      Live Births             Home Medications    Prior to Admission medications   Medication Sig Start Date End Date Taking? Authorizing Provider  ALPRAZolam Prudy Feeler) 0.5 MG tablet Take 1 tablet (0.5 mg total) by mouth at bedtime as needed for anxiety or sleep. 02/11/22   Wallis Bamberg, PA-C  busPIRone (BUSPAR) 5 MG tablet Take 1 tablet (5 mg total) by mouth 3 (three) times daily. 02/11/22   Wallis Bamberg, PA-C  Cholecalciferol (VITAMIN D PO) Take 1 tablet by mouth daily.    [provider]  Coenzyme Q10 (CO Q 10) 10 MG CAPS Take 1 tablet by mouth daily.    [provider]  hydrOXYzine (ATARAX) 25 MG tablet Take 0.5-1 tablets (12.5-25 mg total) by mouth every 8 (eight) hours as needed for anxiety. 02/11/22   Wallis Bamberg, PA-C  MAGNESIUM PO Take 2 tablets by mouth daily.    [provider]  Omega-3 Fatty  Acids (FISH OIL PO) Take 1 tablet by mouth daily.    [provider]    Family History Family History  Problem Relation Age of Onset   Heart failure Father    Social History Social History   Tobacco Use   Smoking status: Never  Substance Use Topics   Alcohol use: No   Drug use: No   Allergies   Other, Shellfish allergy, and Tomato  Review of Systems Review of Systems Pertinent findings revealed after performing a 14 point review of systems has been noted in the history of present illness.  Physical Exam Triage Vital Signs ED Triage Vitals  Enc Vitals Group     BP 03/15/21 0827 (!) 147/82     Pulse Rate 03/15/21 0827 72     Resp 03/15/21 0827 18     Temp 03/15/21 0827 98.3 F (36.8 C)     Temp Source 03/15/21 0827 Oral     SpO2 03/15/21 0827 98 %     Weight --      Height --      Head Circumference --  Peak Flow --      Pain Score 03/15/21 0826 5     Pain Loc --      Pain Edu? --      Excl. in GC? --   No data found.  Updated Vital Signs BP 110/76 (BP Location: Left Arm)   Pulse 82   Temp 98.4 F (36.9 C) (Oral)   Resp 18   LMP 02/14/2022   SpO2 98%   Physical Exam Vitals and nursing note reviewed.  Constitutional:      General: She is not in acute distress.    Appearance: Normal appearance.  HENT:     Head: Normocephalic and atraumatic.  Eyes:     Pupils: Pupils are equal, round, and reactive to light.  Cardiovascular:     Rate and Rhythm: Normal rate and regular rhythm.  Pulmonary:     Effort: Pulmonary effort is normal.     Breath sounds: Normal breath sounds.  Musculoskeletal:        General: Normal range of motion.     Cervical back: Normal range of motion and neck supple.  Skin:    General: Skin is warm and dry.  Neurological:     General: No focal deficit present.     Mental Status: She is alert and oriented to person, place, and time. Mental status is at baseline.  Psychiatric:        Attention and Perception: Attention  and perception normal.        Mood and Affect: Mood is anxious.        Speech: Speech normal.        Behavior: Behavior normal. Behavior is cooperative.        Thought Content: Thought content normal.        Judgment: Judgment normal.     Visual Acuity Right Eye Distance:   Left Eye Distance:   Bilateral Distance:    Right Eye Near:   Left Eye Near:    Bilateral Near:     UC Couse / Diagnostics / Procedures:     Radiology No results found.  Procedures ED EKG  Date/Time: 03/04/2022 6:09 PM  Performed by: Theadora Rama Scales, PA-C Authorized by: Theadora Rama Scales, PA-C   ECG reviewed by ED Physician in the absence of a cardiologist: yes   Previous ECG:    Previous ECG:  Compared to current   Similarity:  No change Interpretation:    Interpretation: normal   Rate:    ECG rate assessment: normal   Rhythm:    Rhythm: sinus rhythm   Ectopy:    Ectopy: none   QRS:    QRS axis:  Normal   QRS intervals:  Normal   QRS conduction: normal   ST segments:    ST segments:  Normal T waves:    T waves: normal   Q waves:    Abnormal Q-waves: not present    (including critical care time) EKG  Pending results:  Labs Reviewed - No data to display  Medications Ordered in UC: Medications - No data to display  UC Diagnoses / Final Clinical Impressions(s)   I have reviewed the triage vital signs and the nursing notes.  Pertinent labs & imaging results that were available during my care of the patient were reviewed by me and considered in my medical decision making (see chart for details).    Final diagnoses:  Anxiety about health   Advised patient of normal EKG findings.  Patient expressed  an unwillingness to continue taking the antianxiety medications that were prescribed at her last visit, does not want this to become habit-forming.  Patient provided with information about the [facility] and encouraged to reach out to them to  discuss options for counseling.  Patient verbalized understanding and agreement with the plan.  ED Prescriptions   None    PDMP not reviewed this encounter.  Pending results:  Labs Reviewed - No data to display  Discharge Instructions:   Discharge Instructions      Your EKG today is normal.  There is no evidence of any cardiac disease.  I encourage you to consider reaching out to the [facility] for follow-up of your anxiety.  Counseling and medication is recommended.  Thank you for visiting urgent care today.      Disposition Upon Discharge:  Condition: stable for discharge home  Patient presented with an acute illness with associated systemic symptoms and significant discomfort requiring urgent management. In my opinion, this is a condition that a prudent lay person (someone who possesses an average knowledge of health and medicine) may potentially expect to result in complications if not addressed urgently such as respiratory distress, impairment of bodily function or dysfunction of bodily organs.   Routine symptom specific, illness specific and/or disease specific instructions were discussed with the patient and/or caregiver at length.   As such, the patient has been evaluated and assessed, work-up was performed and treatment was provided in alignment with urgent care protocols and evidence based medicine.  Patient/parent/caregiver has been advised that the patient may require follow up for further testing and treatment if the symptoms continue in spite of treatment, as clinically indicated and appropriate.  Patient/parent/caregiver has been advised to return to the [facility] or PCP if no better; to PCP or the Emergency Department if new signs and symptoms develop, or if the current signs or symptoms continue to change or worsen for further workup, evaluation and treatment as clinically indicated and appropriate  The patient will follow up with their  current PCP if and as advised. If the patient does not currently have a PCP we will assist them in obtaining one.   The patient may need specialty follow up if the symptoms continue, in spite of conservative treatment and management, for further workup, evaluation, consultation and treatment as clinically indicated and appropriate.   Patient/parent/caregiver verbalized understanding and agreement of plan as discussed.  All questions were addressed during visit.  Please see discharge instructions below for further details of plan.  This office note has been dictated using Museum/gallery curator.  Unfortunately, this method of dictation can sometimes lead to typographical or grammatical errors.  I apologize for your inconvenience in advance if this occurs.  Please do not hesitate to reach out to me if clarification is needed.      Lynden Oxford Scales, PA-C 03/04/22 1812

## 2022-03-04 NOTE — Discharge Instructions (Signed)
Your EKG today is normal.  There is no evidence of any cardiac disease.  I encourage you to consider reaching out to the [facility] for follow-up of your anxiety.  Counseling and medication is recommended.  Thank you for visiting urgent care today.

## 2022-03-04 NOTE — ED Triage Notes (Signed)
Patient c/o nagging sensation (intermittent) in her chest, that started about a week ago. The patient denies SOB and weakness. Patient is a&o x 4.   Home interventions: coQ-10, asa

## 2022-06-06 ENCOUNTER — Ambulatory Visit
Admission: RE | Admit: 2022-06-06 | Discharge: 2022-06-06 | Disposition: A | Discharge status: Home / Self Care | Payer: PRIVATE HEALTH INSURANCE | Source: Ambulatory Visit | Attending: Urgent Care | Admitting: Urgent Care

## 2022-06-06 VITALS — BP 114/80 | HR 76 | Temp 98.1°F | Resp 18

## 2022-06-06 DIAGNOSIS — F419 Anxiety disorder, unspecified: Secondary | ICD-10-CM

## 2022-06-06 DIAGNOSIS — F4321 Adjustment disorder with depressed mood: Secondary | ICD-10-CM

## 2022-06-06 MED ORDER — HYDROXYZINE HCL 25 MG PO TABS: 12.5000 mg | ORAL_TABLET | Freq: Three times a day (TID) | ORAL | 0 refills | Status: AC | PRN

## 2022-06-06 MED ORDER — SERTRALINE HCL 50 MG PO TABS: 50.0000 mg | ORAL_TABLET | Freq: Every day | ORAL | 0 refills | Status: AC

## 2022-06-06 NOTE — ED Triage Notes (Addendum)
Pt reports increase in anxiety x 2 days-states she is out of rx meds-NAD-steady gait

## 2022-06-06 NOTE — ED Provider Notes (Signed)
[facility]  Note:  This document was prepared using Systems analyst and may include unintentional dictation errors.  MRN: [id number] DOB: [birth year]  Subjective:   Janice Bautista is a 47 y.o. female presenting for recheck on her anxiety.  Patient has a PCP and will follow-up with him in a month.  She has run out BuSpar, hydroxyzine and Xanax.  Unfortunately, she has had a very difficult past few months.  Initially started out with a burden some work position.  She has since been able to change this for herself.  Has been doing much better since then but unfortunately has had multiple deaths in the family.  She is also having to be a pillar of support for the rest of her family.  Would like to discuss more treatment options for her anxiety.  No SI, HI.  No current facility-administered medications for this encounter.  Current Outpatient Medications:    ALPRAZolam (XANAX) 0.5 MG tablet, Take 1 tablet (0.5 mg total) by mouth at bedtime as needed for anxiety or sleep., Disp: 10 tablet, Rfl: 0   busPIRone (BUSPAR) 5 MG tablet, Take 1 tablet (5 mg total) by mouth 3 (three) times daily., Disp: 30 tablet, Rfl: 1   Cholecalciferol (VITAMIN D PO), Take 1 tablet by mouth daily., Disp: , Rfl:    Coenzyme Q10 (CO Q 10) 10 MG CAPS, Take 1 tablet by mouth daily., Disp: , Rfl:    hydrOXYzine (ATARAX) 25 MG tablet, Take 0.5-1 tablets (12.5-25 mg total) by mouth every 8 (eight) hours as needed for anxiety., Disp: 30 tablet, Rfl: 0   MAGNESIUM PO, Take 2 tablets by mouth daily., Disp: , Rfl:    Omega-3 Fatty Acids (FISH OIL PO), Take 1 tablet by mouth daily., Disp: , Rfl:    Allergies  Allergen Reactions   Other Hives and Swelling    Seafood   Shellfish Allergy Hives   Tomato Hives, Itching, Other (See Comments) and Swelling    Past Medical History:  Diagnosis Date   Ovarian cyst      Past Surgical History:  Procedure Laterality Date   NO PAST SURGERIES       Family History  Problem Relation Age of Onset   Heart failure Father     Social History   Tobacco Use   Smoking status: Never   Smokeless tobacco: Never  Vaping Use   Vaping Use: Never used  Substance Use Topics   Alcohol use: No   Drug use: No    ROS   Objective:   Vitals: BP 114/80 (BP Location: Left Arm)   Pulse 76   Temp 98.1 F (36.7 C) (Oral)   Resp 18   LMP 05/30/2022   SpO2 99%   Physical Exam Constitutional:      General: She is not in acute distress.    Appearance: Normal appearance. She is well-developed. She is not ill-appearing, toxic-appearing or diaphoretic.  HENT:     Head: Normocephalic and atraumatic.     Nose: Nose normal.     Mouth/Throat:     Mouth: Mucous membranes are moist.  Eyes:     General: No scleral icterus.       Right eye: No discharge.        Left eye: No discharge.     Extraocular Movements: Extraocular movements intact.  Cardiovascular:     Rate and Rhythm: Normal rate.  Pulmonary:     Effort: Pulmonary effort is normal.  Skin:    General: Skin is warm and dry.  Neurological:     General: No focal deficit present.     Mental Status: She is alert and oriented to person, place, and time.  Psychiatric:        Mood and Affect: Mood normal.        Behavior: Behavior normal.        Thought Content: Thought content normal.        Judgment: Judgment normal.     Assessment and Plan :   I have reviewed the PDMP during this encounter.  1. Anxiety   2. Grieving     I will have patient start Zoloft at 50 mg once daily long-term.  Recommended hydroxyzine instead of Xanax for now for breakthrough anxiety.  Maintain follow-up with her PCP.  I did encourage patient to seek counseling as well to help her cope through her grieving. Counseled patient on potential for adverse effects with medications prescribed/recommended today, ER and return-to-clinic precautions discussed, patient verbalized understanding.    Jaynee Eagles,  [place] 06/06/22 1930

## 2022-09-04 ENCOUNTER — Ambulatory Visit: Payer: PRIVATE HEALTH INSURANCE

## 2023-05-26 ENCOUNTER — Encounter: Payer: Self-pay | Admitting: Gastroenterology

## 2023-06-24 ENCOUNTER — Ambulatory Visit: Payer: PRIVATE HEALTH INSURANCE

## 2023-06-24 NOTE — Progress Notes (Unsigned)
 RN attempted call to patient for pre-visit prior to colonoscopy. RN left vm for patient to call back today or we will need to cancel procedure. Letter has been mailed to patient notifying them of cancellation.

## 2023-07-06 ENCOUNTER — Encounter: Payer: PRIVATE HEALTH INSURANCE | Admitting: Gastroenterology

## 2024-03-30 ENCOUNTER — Ambulatory Visit
Admission: EM | Admit: 2024-03-30 | Discharge: 2024-03-30 | Disposition: A | Discharge status: Home / Self Care | Payer: PRIVATE HEALTH INSURANCE | Attending: Family Medicine | Admitting: Family Medicine

## 2024-03-30 DIAGNOSIS — L209 Atopic dermatitis, unspecified: Secondary | ICD-10-CM | POA: Diagnosis not present

## 2024-03-30 DIAGNOSIS — H1013 Acute atopic conjunctivitis, bilateral: Secondary | ICD-10-CM | POA: Diagnosis not present

## 2024-03-30 MED ORDER — PREDNISONE 20 MG PO TABS: ORAL_TABLET | ORAL | 0 refills | Status: AC

## 2024-03-30 MED ORDER — AZELASTINE HCL 0.05 % OP SOLN: 1.0000 [drp] | Freq: Every day | OPHTHALMIC | 0 refills | Status: AC | PRN

## 2024-03-30 NOTE — ED Triage Notes (Signed)
 Pt reports burning and discomfort is eyes since 0200 am today. Benadryl and artificial tears gives no relief. 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

## 2024-03-30 NOTE — ED Provider Notes (Signed)
 [facility]  Note:  This document was prepared using Conservation officer, historic buildings and may include unintentional dictation errors.  MRN: [id number] DOB: 06-25-1975  Subjective:   Janice Bautista is a 48 y.o. female presenting for acute onset overnight of bilateral eye burning, eye itching and irritation, redness and photophobia.  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  Cannot state specifically that she came into any particular allergen.  No eye trauma.  No vision loss.  She did use artificial tears/drops that cleared up the redness of her eyes.  She also took Benadryl.  No throat closing, facial or oral swelling, shortness of breath, wheezing.  Patient also ate a couple of shrimp yesterday, has known allergy to shellfish.  No current facility-administered medications for this encounter.  Current Outpatient Medications:    ALPRAZolam  (XANAX ) 0.5 MG tablet, Take 1 tablet (0.5 mg total) by mouth at bedtime as needed for anxiety or sleep., Disp: 10 tablet, Rfl: 0   Cholecalciferol (VITAMIN D PO), Take 1 tablet by mouth daily., Disp: , Rfl:    Coenzyme Q10 (CO Q 10) 10 MG CAPS, Take 1 tablet by mouth daily., Disp: , Rfl:    hydrOXYzine  (ATARAX ) 25 MG tablet, Take 0.5-1 tablets (12.5-25 mg total) by mouth every 8 (eight) hours as needed for anxiety., Disp: 30 tablet, Rfl: 0   MAGNESIUM PO, Take 2 tablets by mouth daily., Disp: , Rfl:    Omega-3 Fatty Acids (FISH OIL PO), Take 1 tablet by mouth daily., Disp: , Rfl:    sertraline  (ZOLOFT ) 50 MG tablet, Take 1 tablet (50 mg total) by mouth daily., Disp: 90 tablet, Rfl: 0   Allergies  Allergen Reactions   Latex Hives   Other Hives and Swelling    Seafood   Shellfish Allergy Hives   Tomato Hives, Itching, Other (See Comments) and Swelling    Past Medical History:  Diagnosis Date   Ovarian cyst      Past Surgical History:  Procedure Laterality Date   NO PAST SURGERIES      Family  History  Problem Relation Age of Onset   Heart failure Father     Social History   Tobacco Use   Smoking status: Never   Smokeless tobacco: Never  Vaping Use   Vaping status: Never Used  Substance Use Topics   Alcohol use: No   Drug use: No    ROS   Objective:   Vitals: BP 132/81 (BP Location: Left Arm)   Pulse 84   Temp 97.9 F (36.6 C) (Oral)   Resp 19   LMP  (Within Days)   SpO2 99%   Physical Exam Constitutional:      General: She is not in acute distress.    Appearance: Normal appearance. She is well-developed. She is not ill-appearing, toxic-appearing or diaphoretic.  HENT:     Head: Normocephalic and atraumatic.     Nose: Nose normal.     Mouth/Throat:     Mouth: Mucous membranes are moist.     Comments: No facial or oral swelling.  Airway is patent, patient controlling secretions and speaking in full sentences. Eyes:     General: Lids are everted, no foreign bodies appreciated. Vision grossly intact. No scleral icterus.       Right eye: No foreign body, discharge or hordeolum.        Left eye: No foreign body, discharge or hordeolum.     Extraocular Movements: Extraocular  movements intact.     Right eye: Normal extraocular motion.     Left eye: Normal extraocular motion and no nystagmus.     Conjunctiva/sclera:     Right eye: Right conjunctiva is not injected. No chemosis, exudate or hemorrhage.    Left eye: Left conjunctiva is not injected. No chemosis, exudate or hemorrhage.    Comments: Dry scaly hyperpigmented irritated skin of the eyelids worse on lower eyelids bilaterally but also involve the upper eyelids.  Cardiovascular:     Rate and Rhythm: Normal rate.  Pulmonary:     Effort: Pulmonary effort is normal.  Skin:    General: Skin is warm and dry.  Neurological:     General: No focal deficit present.     Mental Status: She is alert and oriented to person, place, and time.  Psychiatric:        Mood and Affect: Mood normal.        Behavior:  Behavior normal.     Assessment and Plan :   PDMP not reviewed this encounter.  1. Atopic dermatitis, unspecified type   2. Allergic conjunctivitis of both eyes    Suspect an atopic dermatitis, allergic conjunctivitis.  Recommended an oral prednisone course and azelastine eyedrops.  Advised avoiding known allergens.  Counseled patient on potential for adverse effects with medications prescribed/recommended today, ER and return-to-clinic precautions discussed, patient verbalized understanding.    Christopher Savannah, PA-C 03/30/24 [id number]
